# Patient Record
Sex: Male | Born: 1975 | Race: White | Hispanic: No | Marital: Married | Smoking: Never smoker
Health system: Southern US, Community
[De-identification: ages and names within clinical notes are randomized; demographics above are authoritative.]

## PROBLEM LIST (undated history)

## (undated) DIAGNOSIS — G40909 Epilepsy, unspecified, not intractable, without status epilepticus: Secondary | ICD-10-CM

## (undated) HISTORY — DX: Epilepsy, unspecified, not intractable, without status epilepticus: G40.909

## (undated) HISTORY — PX: NO PAST SURGERIES: SHX2092

---

## 1996-06-26 DIAGNOSIS — G40909 Epilepsy, unspecified, not intractable, without status epilepticus: Secondary | ICD-10-CM

## 1996-06-26 HISTORY — DX: Epilepsy, unspecified, not intractable, without status epilepticus: G40.909

## 2010-02-15 ENCOUNTER — Ambulatory Visit: Payer: Self-pay | Admitting: Internal Medicine

## 2010-02-15 DIAGNOSIS — B351 Tinea unguium: Secondary | ICD-10-CM

## 2010-02-15 DIAGNOSIS — B079 Viral wart, unspecified: Secondary | ICD-10-CM | POA: Insufficient documentation

## 2010-02-15 DIAGNOSIS — G40909 Epilepsy, unspecified, not intractable, without status epilepticus: Secondary | ICD-10-CM | POA: Insufficient documentation

## 2010-02-15 DIAGNOSIS — R569 Unspecified convulsions: Secondary | ICD-10-CM

## 2010-02-15 DIAGNOSIS — B353 Tinea pedis: Secondary | ICD-10-CM

## 2010-02-15 LAB — CONVERTED CEMR LAB: Vit D, 25-Hydroxy: 44 ng/mL (ref 30–89)

## 2010-02-16 LAB — CONVERTED CEMR LAB
Albumin: 4.8 g/dL (ref 3.5–5.2)
Alkaline Phosphatase: 60 units/L (ref 39–117)
BUN: 18 mg/dL (ref 6–23)
Basophils Absolute: 0 10*3/uL (ref 0.0–0.1)
Bilirubin, Direct: 0.1 mg/dL (ref 0.0–0.3)
CO2: 31 meq/L (ref 19–32)
Calcium: 9.4 mg/dL (ref 8.4–10.5)
Creatinine, Ser: 0.9 mg/dL (ref 0.4–1.5)
Eosinophils Absolute: 0.1 10*3/uL (ref 0.0–0.7)
Glucose, Bld: 108 mg/dL — ABNORMAL HIGH (ref 70–99)
HDL: 46.8 mg/dL (ref >39.00)
Ketones, ur: NEGATIVE mg/dL
Lymphocytes Relative: 40.5 % (ref 12.0–46.0)
MCHC: 34.6 g/dL (ref 30.0–36.0)
MCV: 95.8 fL (ref 78.0–100.0)
Monocytes Absolute: 0.3 10*3/uL (ref 0.1–1.0)
Neutrophils Relative %: 49 % (ref 43.0–77.0)
RDW: 13.6 % (ref 11.5–14.6)
Specific Gravity, Urine: 1.025 (ref 1.000–1.030)
TSH: 1 microintl units/mL (ref 0.35–5.50)
Total CHOL/HDL Ratio: 3
Total Protein, Urine: NEGATIVE mg/dL
Triglycerides: 105 mg/dL (ref 0.0–149.0)
Urine Glucose: NEGATIVE mg/dL
Urobilinogen, UA: 0.2 (ref 0.0–1.0)
VLDL: 21 mg/dL (ref 0.0–40.0)

## 2010-03-03 ENCOUNTER — Encounter: Payer: Self-pay | Admitting: Internal Medicine

## 2010-03-03 ENCOUNTER — Telehealth (INDEPENDENT_AMBULATORY_CARE_PROVIDER_SITE_OTHER): Payer: Self-pay | Admitting: *Deleted

## 2010-07-26 NOTE — Assessment & Plan Note (Signed)
Summary: CPX/[employer]/#/CD -- ok dr Macario Golds   Vital Signs:  Patient profile:   35 year old male Height:      71 inches Weight:      202 pounds BMI:     28.28 O2 Sat:      98 % on Room air Temp:     98.1 degrees F oral Pulse rate:   80 / minute Pulse rhythm:   regular Resp:     16 per minute BP sitting:   130 / 98  (left arm) Cuff size:   regular  Vitals Entered By: Lanier Prude, CMA(AAMA) (February 15, 2010 10:33 AM)  O2 Flow:  Room air CC: Est PCP; c/o multiple skin lesions Is Patient Diabetic? No   CC:  Est PCP; c/o multiple skin lesions.  History of Present Illness: The patient presents for a preventive health examination - new pt. C/o warts - toe and and finger C/o toe fungus and a "mole"  Preventive Screening-Counseling & Management  Alcohol-Tobacco     Smoking Status: never  Caffeine-Diet-Exercise     Does Patient Exercise: yes  Current Medications (verified): 1)  Dilantin 100 Mg Caps (Phenytoin Sodium Extended) .Marland Kitchen.. 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 Married Never Smoked Regular exercise-yes Smoking Status:  never Does Patient Exercise:  yes  Review of Systems  The patient denies anorexia, fever, weight loss, weight gain, vision loss, decreased hearing, hoarseness, chest pain, syncope, dyspnea on exertion, peripheral edema, prolonged cough, headaches, hemoptysis, abdominal pain, melena, hematochezia, severe indigestion/heartburn, hematuria, incontinence, genital sores, muscle weakness, suspicious skin lesions, transient blindness, difficulty walking, depression, unusual weight change, abnormal bleeding, enlarged lymph nodes, angioedema, and testicular masses.    Physical Exam  General:   Well-developed,well-nourished,in no acute distress; alert,appropriate and cooperative throughout examination Head:  Normocephalic and atraumatic without obvious abnormalities. No apparent alopecia or balding. Eyes:  No corneal or conjunctival inflammation noted. EOMI. Perrla. Funduscopic exam benign, without hemorrhages, exudates or papilledema. Vision grossly normal. Ears:  External ear exam shows no significant lesions or deformities.  Otoscopic examination reveals clear canals, tympanic membranes are intact bilaterally without bulging, retraction, inflammation or discharge. Hearing is grossly normal bilaterally. Nose:  External nasal examination shows no deformity or inflammation. Nasal mucosa are pink and moist without lesions or exudates. Mouth:  Oral mucosa and oropharynx without lesions or exudates.  Teeth in good repair. Neck:  No deformities, masses, or tenderness noted. Lungs:  Normal respiratory effort, chest expands symmetrically. Lungs are clear to auscultation, no crackles or wheezes. Heart:  Normal rate and regular rhythm. S1 and S2 normal without gallop, murmur, click, rub or other extra sounds. Abdomen:  Bowel sounds positive,abdomen soft and non-tender without masses, organomegaly or hernias noted. Genitalia:  Testes bilaterally descended without nodularity, tenderness or masses. No scrotal masses or lesions. No penis lesions or urethral discharge. Msk:  No deformity or scoliosis noted of thoracic or lumbar spine.   Pulses:  R and L carotid,radial,femoral,dorsalis pedis and posterior tibial pulses are full and equal bilaterally Extremities:  No clubbing, cyanosis, edema, or deformity noted with normal full range of motion of all joints.   Neurologic:  No cranial nerve deficits noted. Station and gait are normal. Plantar reflexes are down-going bilaterally. DTRs are symmetrical throughout. Sensory, motor  and coordinative functions appear intact. Skin:  L 3d finger wart R 5th toe  wart L great toe w/onycho and T pedis no susp. moles Cervical Nodes:  No lymphadenopathy noted Inguinal Nodes:  No significant adenopathy Psych:  Cognition and judgment appear intact. Alert and cooperative with normal attention span and concentration. No apparent delusions, illusions, hallucinations   Impression & Recommendations:  Problem # 1:  HEALTH MAINTENANCE EXAM (ICD-V70.0) Assessment New Health and age related issues were discussed. Available screening tests and vaccinations were discussed as well. Healthy life style including good diet and exercise was discussed.  Labs ordered Orders: TLB-BMP (Basic Metabolic Panel-BMET) (80048-METABOL) TLB-CBC Platelet - w/Differential (85025-CBCD) TLB-Hepatic/Liver Function Pnl (80076-HEPATIC) TLB-Lipid Panel (80061-LIPID) TLB-TSH (Thyroid Stimulating Hormone) (84443-TSH)  Problem # 2:  TINEA PEDIS (ICD-110.4) L foot Assessment: New  His updated medication list for this problem includes:    Penlac 8 % Soln (Ciclopirox) ..... Use once daily on affected nail(s). once a week remove the build-up with an alcohol swab    Ketoconazole 2 % Crea (Ketoconazole) ..... 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 Use once daily on affected nail(s). once a week remove the build-up with an alcohol swab    Ketoconazole 2 % Crea (Ketoconazole) ..... Use bid  Problem # 4:  SEIZURE DISORDER (ICD-780.39) Assessment: Unchanged  His updated medication list for this problem includes:    Dilantin 100 Mg Caps (Phenytoin sodium extended) .Marland Kitchen... 1 once daily  Orders: T-Vitamin D (25-Hydroxy) [id number]) TLB-B12, Serum-Total ONLY (82607-B12) TLB-Udip ONLY (81003-UDIP)  Problem # 5:  WART, VIRAL (ICD-078.10) x2 Assessment: New  Orders: Wart Destruct <14 (17110) Procedure: cryo Indication: warts Risks incl. scar(s), incomplete  removal, ect.  and benefits discussed   2   lesion(s) on ( one on L 5th digit and one on L finger) was/were treated with liqid N2 in usual fasion.  Tolerated well. Compl. none. Wound care instructions given.   Complete Medication List: 1)  Dilantin 100 Mg Caps (Phenytoin sodium extended) .Marland Kitchen.. 1 once daily 2)  Vitamin D 1000 Unit Tabs (Cholecalciferol) .Marland Kitchen.. 1 by mouth qd 3)  Penlac 8 % Soln (Ciclopirox) .... Use once daily on affected nail(s). once a week remove the build-up with an alcohol swab 4)  Ketoconazole 2 % Crea (Ketoconazole) .... Use bid 5)  Vitamin B-12 500 Mcg Tabs (Cyanocobalamin) .Marland Kitchen.. 1 by mouth once daily for vitamin b12 deficiency  Other Orders: Tdap => 60yrs IM (02725) Admin 1st Vaccine (36644)  Patient Instructions: 1)  Please schedule a follow-up appointment in 1 year. Prescriptions: KETOCONAZOLE 2 % CREA (KETOCONAZOLE) use bid  #90 g x 3   Entered and Authorized by:   Tresa Garter MD   Signed by:   Tresa Garter MD on 02/15/2010   Method used:   Print then Give to Patient   RxID:   [id number] PENLAC 8 % SOLN (CICLOPIROX) Use once daily on affected nail(s). Once a week remove the build-up with an alcohol swab  #1 x 2   Entered and Authorized by:   Tresa Garter MD   Signed by:   Tresa Garter MD on 02/15/2010   Method used:   Print then Give to Patient   RxID:   [id number]    Immunizations Administered:  Tetanus Vaccine:    Vaccine Type: Tdap    Site:  left deltoid    Mfr: GlaxoSmithKline    Dose: 0.5 ml    Route: IM    Given by: Lanier Prude, CMA(AAMA)    Exp. Date: 04/14/2012    Lot #: [id number]    VIS given: 05/14/07 version given February 15, 2010.

## 2010-07-26 NOTE — Progress Notes (Signed)
Summary: PA-Ciclopirox  Phone Note From Pharmacy   Summary of Call: PA-Ciclopirox faxed to [facility] @ [phone] awaiting approval. Dagoberto Reef  March 03, 2010 8:35 AM  PA -approved 02/15/10-02/15/11, pt aware.  Initial call taken by: Dagoberto Reef,  March 03, 2010 2:45 PM

## 2010-07-26 NOTE — Medication Information (Signed)
Summary: Prior Autho & Approved for Ciclopirox/[facility]  Prior Autho & Approved for Ciclopirox/[facility]   Imported By: Sherian Rein 03/07/2010 12:26:55  _____________________________________________________________________  External Attachment:    Type:   Image     Comment:   External Document

## 2010-08-18 ENCOUNTER — Encounter: Payer: Self-pay | Admitting: Internal Medicine

## 2010-08-25 ENCOUNTER — Other Ambulatory Visit: Payer: Self-pay | Admitting: Neurology

## 2010-08-25 DIAGNOSIS — Z79899 Other long term (current) drug therapy: Secondary | ICD-10-CM

## 2010-09-01 NOTE — Letter (Signed)
Summary: [facility]  [facility]   Imported By: Sherian Rein 08/26/2010 12:54:52  _____________________________________________________________________  External Attachment:    Type:   Image     Comment:   External Document

## 2010-11-09 ENCOUNTER — Other Ambulatory Visit: Payer: Self-pay | Admitting: Internal Medicine

## 2010-11-11 NOTE — Telephone Encounter (Signed)
Ok to Rf? Med is not active in [facility] 

## 2010-11-14 NOTE — Telephone Encounter (Signed)
OK to fill this prescription with additional refills x0 Thank you!  

## 2010-11-15 NOTE — Telephone Encounter (Signed)
Ok to Rf? Med is not active in [facility]ric[facility]

## 2010-11-22 ENCOUNTER — Telehealth: Payer: Self-pay | Admitting: *Deleted

## 2010-11-22 NOTE — Telephone Encounter (Signed)
OK to fill this prescription with additional refills x1 Thank you!  

## 2010-11-22 NOTE — Telephone Encounter (Signed)
rec rf req for Ciclopirox 8% soln. Use qd on affected nails. Once week remove build up with alcohol. # 6. Med is not active in [facility]..... Ok to Rf?

## 2010-11-23 ENCOUNTER — Other Ambulatory Visit: Payer: Self-pay | Admitting: *Deleted

## 2010-11-23 MED ORDER — CICLOPIROX 8 % EX SOLN: Freq: Every day | CUTANEOUS | Status: DC

## 2010-11-24 ENCOUNTER — Telehealth: Payer: Self-pay | Admitting: *Deleted

## 2010-11-24 NOTE — Telephone Encounter (Signed)
Called to obtain PA on Ciclopirox 8 %. Form is being faxed.

## 2010-11-25 NOTE — Telephone Encounter (Signed)
Pt's wife informed med was approved.

## 2010-12-05 ENCOUNTER — Encounter: Payer: Self-pay | Admitting: Internal Medicine

## 2010-12-06 ENCOUNTER — Encounter: Payer: Self-pay | Admitting: Internal Medicine

## 2010-12-06 ENCOUNTER — Other Ambulatory Visit (INDEPENDENT_AMBULATORY_CARE_PROVIDER_SITE_OTHER): Payer: Managed Care, Other (non HMO)

## 2010-12-06 ENCOUNTER — Ambulatory Visit (INDEPENDENT_AMBULATORY_CARE_PROVIDER_SITE_OTHER): Payer: Managed Care, Other (non HMO) | Admitting: Internal Medicine

## 2010-12-06 DIAGNOSIS — R5383 Other fatigue: Secondary | ICD-10-CM

## 2010-12-06 DIAGNOSIS — B351 Tinea unguium: Secondary | ICD-10-CM

## 2010-12-06 DIAGNOSIS — R5381 Other malaise: Secondary | ICD-10-CM

## 2010-12-06 DIAGNOSIS — L600 Ingrowing nail: Secondary | ICD-10-CM

## 2010-12-06 LAB — CBC WITH DIFFERENTIAL/PLATELET
Basophils Relative: 0.4 % (ref 0.0–3.0)
Eosinophils Relative: 3.5 % (ref 0.0–5.0)
HCT: 43.5 % (ref 39.0–52.0)
Lymphs Abs: 1.8 10*3/uL (ref 0.7–4.0)
MCV: 94.3 fl (ref 78.0–100.0)
Monocytes Absolute: 0.3 10*3/uL (ref 0.1–1.0)
Monocytes Relative: 7.2 % (ref 3.0–12.0)
Neutrophils Relative %: 43.2 % (ref 43.0–77.0)
RBC: 4.61 Mil/uL (ref 4.22–5.81)
WBC: 4 10*3/uL — ABNORMAL LOW (ref 4.5–10.5)

## 2010-12-06 LAB — URINALYSIS
Nitrite: NEGATIVE
Specific Gravity, Urine: 1.02 (ref 1.000–1.030)
Total Protein, Urine: NEGATIVE
Urine Glucose: NEGATIVE
Urobilinogen, UA: 0.2 (ref 0.0–1.0)

## 2010-12-06 LAB — COMPREHENSIVE METABOLIC PANEL
AST: 20 U/L (ref 0–37)
Alkaline Phosphatase: 55 U/L (ref 39–117)
BUN: 19 mg/dL (ref 6–23)
Creatinine, Ser: 1 mg/dL (ref 0.4–1.5)

## 2010-12-06 LAB — TESTOSTERONE: Testosterone: 375.91 ng/dL (ref 350.00–890.00)

## 2010-12-06 LAB — TSH: TSH: 1.25 u[IU]/mL (ref 0.35–5.50)

## 2010-12-06 MED ORDER — TERBINAFINE HCL 250 MG PO TABS: ORAL_TABLET | ORAL | Status: DC

## 2010-12-06 NOTE — Progress Notes (Signed)
  Subjective:    Patient ID: Jay Hall, male    DOB: January 23, 1976, 35 y.o.   MRN: [id number]  HPI  C/o toenail fungus coming back after nail loss/regrowth   Review of Systems  Constitutional: Negative for fatigue.  Cardiovascular: Negative for leg swelling.  Gastrointestinal: Negative for diarrhea and abdominal distention.  Neurological: Negative for seizures.       Objective:   Physical Exam  Constitutional: No distress.  Musculoskeletal: He exhibits no edema.  Skin:       L big toenail w/nail base and sides onycho and ingr toenail w/skin fungus  Psychiatric: He has a normal mood and affect. Judgment normal.       L big toe   Assessment & Plan:

## 2010-12-07 ENCOUNTER — Telehealth: Payer: Self-pay | Admitting: Internal Medicine

## 2010-12-07 DIAGNOSIS — E291 Testicular hypofunction: Secondary | ICD-10-CM

## 2010-12-07 DIAGNOSIS — L600 Ingrowing nail: Secondary | ICD-10-CM | POA: Insufficient documentation

## 2010-12-07 NOTE — Assessment & Plan Note (Signed)
Podiatry consult

## 2010-12-07 NOTE — Assessment & Plan Note (Signed)
He is failing Penlac. We will start Lmisil po.  Potential benefits of a long term Lamisil  use as well as potential risks  and complications were explained to the patient and were aknowledged. Dilantin interaction was checked. Will monitor labs

## 2010-12-07 NOTE — Telephone Encounter (Signed)
Jay Hall, please, inform patient that all labs are normal except for low testost. Take Lamisil Start exercising. Check testost, LH, FSH in 1 mo. RTC 5 wks Thx

## 2010-12-08 NOTE — Telephone Encounter (Signed)
Left mess for patient to call back.  

## 2010-12-08 NOTE — Telephone Encounter (Signed)
Pt informed

## 2011-01-09 ENCOUNTER — Other Ambulatory Visit: Payer: Self-pay | Admitting: *Deleted

## 2011-01-09 ENCOUNTER — Other Ambulatory Visit (INDEPENDENT_AMBULATORY_CARE_PROVIDER_SITE_OTHER): Payer: Managed Care, Other (non HMO)

## 2011-01-09 DIAGNOSIS — E291 Testicular hypofunction: Secondary | ICD-10-CM

## 2011-01-09 DIAGNOSIS — R5383 Other fatigue: Secondary | ICD-10-CM

## 2011-01-09 DIAGNOSIS — B351 Tinea unguium: Secondary | ICD-10-CM

## 2011-01-09 LAB — CBC WITH DIFFERENTIAL/PLATELET
Basophils Relative: 0.4 % (ref 0.0–3.0)
Eosinophils Relative: 3.4 % (ref 0.0–5.0)
HCT: 41.6 % (ref 39.0–52.0)
Hemoglobin: 14.3 g/dL (ref 13.0–17.0)
Lymphs Abs: 1.1 10*3/uL (ref 0.7–4.0)
MCV: 95.2 fl (ref 78.0–100.0)
Monocytes Absolute: 0.3 10*3/uL (ref 0.1–1.0)
Monocytes Relative: 9.9 % (ref 3.0–12.0)
Neutro Abs: 1.3 10*3/uL — ABNORMAL LOW (ref 1.4–7.7)
Platelets: 165 10*3/uL (ref 150.0–400.0)
RBC: 4.37 Mil/uL (ref 4.22–5.81)
WBC: 2.8 10*3/uL — ABNORMAL LOW (ref 4.5–10.5)

## 2011-01-09 LAB — COMPREHENSIVE METABOLIC PANEL
Albumin: 4.6 g/dL (ref 3.5–5.2)
Alkaline Phosphatase: 56 U/L (ref 39–117)
BUN: 16 mg/dL (ref 6–23)
Calcium: 8.7 mg/dL (ref 8.4–10.5)
Chloride: 102 mEq/L (ref 96–112)
Glucose, Bld: 75 mg/dL (ref 70–99)
Potassium: 3.9 mEq/L (ref 3.5–5.1)
Sodium: 137 mEq/L (ref 135–145)
Total Protein: 6.3 g/dL (ref 6.0–8.3)

## 2011-01-12 ENCOUNTER — Ambulatory Visit (INDEPENDENT_AMBULATORY_CARE_PROVIDER_SITE_OTHER): Payer: Managed Care, Other (non HMO) | Admitting: Internal Medicine

## 2011-01-12 ENCOUNTER — Encounter: Payer: Self-pay | Admitting: Internal Medicine

## 2011-01-12 DIAGNOSIS — R569 Unspecified convulsions: Secondary | ICD-10-CM

## 2011-01-12 DIAGNOSIS — B353 Tinea pedis: Secondary | ICD-10-CM

## 2011-01-12 DIAGNOSIS — R6882 Decreased libido: Secondary | ICD-10-CM | POA: Insufficient documentation

## 2011-01-12 DIAGNOSIS — B351 Tinea unguium: Secondary | ICD-10-CM

## 2011-01-12 DIAGNOSIS — R7989 Other specified abnormal findings of blood chemistry: Secondary | ICD-10-CM

## 2011-01-12 NOTE — Assessment & Plan Note (Signed)
On Rx 

## 2011-01-12 NOTE — Assessment & Plan Note (Signed)
Worse. Recheck in 4-6 wks

## 2011-01-12 NOTE — Progress Notes (Signed)
  Subjective:    Patient ID: Jay Hall, male    DOB: 10/12/1975, 35 y.o.   MRN: [id number]  HPI   F/u toenail fungus coming back after nail loss/regrowth F/u abn labs, decreased libido  Review of Systems  Constitutional: Negative for fatigue.  Respiratory: Negative for cough.   Cardiovascular: Negative for leg swelling.  Gastrointestinal: Negative for abdominal pain, diarrhea and abdominal distention.  Neurological: Negative for seizures.  Psychiatric/Behavioral: Negative for behavioral problems, sleep disturbance and decreased concentration. The patient is not nervous/anxious.        Objective:   Physical Exam  Constitutional: No distress.  Pulmonary/Chest: No respiratory distress.  Abdominal: He exhibits no distension.  Musculoskeletal: He exhibits no edema.  Skin:       L big toenail w/nail base and sides onycho and ingr toenail w/skin fungus  Psychiatric: He has a normal mood and affect. Judgment normal.       L big toenail w/o improvement so far  Lab Results  Component Value Date   WBC 2.8* 01/09/2011   HGB 14.3 01/09/2011   HCT 41.6 01/09/2011   PLT 165.0 01/09/2011   CHOL 157 02/15/2010   TRIG 105.0 02/15/2010   HDL 46.80 02/15/2010   ALT 27 01/09/2011   AST 24 01/09/2011   NA 137 01/09/2011   K 3.9 01/09/2011   CL 102 01/09/2011   CREATININE 1.0 01/09/2011   BUN 16 01/09/2011   CO2 30 01/09/2011   TSH 1.25 12/06/2010   MICROALBUR 0.8 12/06/2010     Assessment & Plan:

## 2011-01-12 NOTE — Assessment & Plan Note (Signed)
Better Cont L arginine

## 2011-01-12 NOTE — Assessment & Plan Note (Signed)
On Dilantin 

## 2011-01-12 NOTE — Assessment & Plan Note (Addendum)
On Rx. Monitoring labs

## 2011-02-14 ENCOUNTER — Ambulatory Visit: Payer: Managed Care, Other (non HMO) | Admitting: Internal Medicine

## 2011-03-21 ENCOUNTER — Other Ambulatory Visit (INDEPENDENT_AMBULATORY_CARE_PROVIDER_SITE_OTHER): Payer: Managed Care, Other (non HMO)

## 2011-03-21 DIAGNOSIS — R7989 Other specified abnormal findings of blood chemistry: Secondary | ICD-10-CM

## 2011-03-21 DIAGNOSIS — B351 Tinea unguium: Secondary | ICD-10-CM

## 2011-03-21 LAB — CBC WITH DIFFERENTIAL/PLATELET
Basophils Relative: 0.4 % (ref 0.0–3.0)
Eosinophils Relative: 2.9 % (ref 0.0–5.0)
HCT: 43.9 % (ref 39.0–52.0)
Hemoglobin: 15 g/dL (ref 13.0–17.0)
Lymphs Abs: 1.8 10*3/uL (ref 0.7–4.0)
MCV: 95.4 fl (ref 78.0–100.0)
Monocytes Absolute: 0.3 10*3/uL (ref 0.1–1.0)
Neutro Abs: 1.8 10*3/uL (ref 1.4–7.7)
Neutrophils Relative %: 44.7 % (ref 43.0–77.0)
RBC: 4.61 Mil/uL (ref 4.22–5.81)
WBC: 4 10*3/uL — ABNORMAL LOW (ref 4.5–10.5)

## 2011-03-21 LAB — COMPREHENSIVE METABOLIC PANEL
AST: 30 U/L (ref 0–37)
Alkaline Phosphatase: 64 U/L (ref 39–117)
BUN: 17 mg/dL (ref 6–23)
Creatinine, Ser: 1 mg/dL (ref 0.4–1.5)
Glucose, Bld: 92 mg/dL (ref 70–99)
Total Bilirubin: 0.5 mg/dL (ref 0.3–1.2)

## 2011-04-24 ENCOUNTER — Ambulatory Visit: Payer: Managed Care, Other (non HMO) | Admitting: Internal Medicine

## 2011-07-11 ENCOUNTER — Encounter: Payer: Self-pay | Admitting: Internal Medicine

## 2011-07-11 ENCOUNTER — Ambulatory Visit (INDEPENDENT_AMBULATORY_CARE_PROVIDER_SITE_OTHER): Payer: Managed Care, Other (non HMO) | Admitting: Internal Medicine

## 2011-07-11 VITALS — BP 122/88 | HR 80 | Temp 98.1°F | Resp 16 | Wt 203.0 lb

## 2011-07-11 DIAGNOSIS — B351 Tinea unguium: Secondary | ICD-10-CM

## 2011-07-11 DIAGNOSIS — R6882 Decreased libido: Secondary | ICD-10-CM

## 2011-07-11 NOTE — Assessment & Plan Note (Addendum)
Pod cons for poss nail removal vs other L big toe - refractory

## 2011-07-19 NOTE — Progress Notes (Signed)
  Subjective:    Patient ID: Jay Hall, male    DOB: [birth year], 36 y.o.   MRN: [id number]  HPI  F/u L big toe onycho: he is not happy with the treatment results so far (Penlac, PO Lamisil, topical Ketoconazole)  Review of Systems  Constitutional: Negative for activity change, fatigue and unexpected weight change.  Skin: Negative for rash.  Psychiatric/Behavioral: Negative for dysphoric mood.       Objective:   Physical Exam  Constitutional: He appears well-developed. No distress.  Skin:       L big toe toenail with onych (poss 2-3 mm prox clearing is present)          Assessment & Plan:

## 2011-07-19 NOTE — Assessment & Plan Note (Signed)
Resolved

## 2011-09-09 ENCOUNTER — Encounter: Payer: Self-pay | Admitting: Family Medicine

## 2011-09-09 ENCOUNTER — Ambulatory Visit (INDEPENDENT_AMBULATORY_CARE_PROVIDER_SITE_OTHER): Payer: Managed Care, Other (non HMO) | Admitting: Family Medicine

## 2011-09-09 VITALS — BP 128/82 | HR 118 | Temp 100.0°F | Wt 203.0 lb

## 2011-09-09 DIAGNOSIS — J329 Chronic sinusitis, unspecified: Secondary | ICD-10-CM

## 2011-09-09 MED ORDER — AZITHROMYCIN 250 MG PO TABS: ORAL_TABLET | ORAL | Status: AC

## 2011-09-09 NOTE — Progress Notes (Signed)
  Subjective:    Patient ID: Jay Hall, male    DOB: 06-07-1976, 36 y.o.   MRN: [id number]  HPI Here for 3 days of aches, HA, PND, sinus pressure, and a dry cough. No fever   Review of Systems  Constitutional: Positive for fatigue.  HENT: Positive for congestion, postnasal drip and sinus pressure.   Eyes: Negative.   Respiratory: Positive for cough.        Objective:   Physical Exam  Constitutional: He appears well-developed and well-nourished. No distress.  HENT:  Right Ear: External ear normal.  Left Ear: External ear normal.  Nose: Nose normal.  Mouth/Throat: Oropharynx is clear and moist. No oropharyngeal exudate.  Eyes: Conjunctivae are normal.  Pulmonary/Chest: Effort normal and breath sounds normal.  Lymphadenopathy:    He has no cervical adenopathy.          Assessment & Plan:  Rest, fluids, Mucinex

## 2011-09-10 ENCOUNTER — Encounter: Payer: Self-pay | Admitting: Family Medicine

## 2012-07-12 ENCOUNTER — Telehealth: Payer: Self-pay | Admitting: *Deleted

## 2012-07-12 DIAGNOSIS — Z0000 Encounter for general adult medical examination without abnormal findings: Secondary | ICD-10-CM

## 2012-07-12 NOTE — Telephone Encounter (Signed)
Message copied by Merrilyn Puma on Fri Jul 12, 2012 10:40 AM ------      Message from: Burnett Harry      Created: Thu Jun 27, 2012 10:31 AM      Regarding: CPE 07/30/12       [facility]

## 2012-07-12 NOTE — Telephone Encounter (Signed)
CPE 07/30/12 labs entered.

## 2012-07-25 ENCOUNTER — Other Ambulatory Visit (INDEPENDENT_AMBULATORY_CARE_PROVIDER_SITE_OTHER): Payer: Managed Care, Other (non HMO)

## 2012-07-25 DIAGNOSIS — Z0000 Encounter for general adult medical examination without abnormal findings: Secondary | ICD-10-CM

## 2012-07-25 LAB — URINALYSIS, ROUTINE W REFLEX MICROSCOPIC
Bilirubin Urine: NEGATIVE
Hgb urine dipstick: NEGATIVE
Total Protein, Urine: NEGATIVE
Urine Glucose: NEGATIVE
pH: 5.5 (ref 5.0–8.0)

## 2012-07-25 LAB — BASIC METABOLIC PANEL
Chloride: 106 mEq/L (ref 96–112)
GFR: 84.7 mL/min (ref >60.00)
Potassium: 4.2 mEq/L (ref 3.5–5.1)
Sodium: 141 mEq/L (ref 135–145)

## 2012-07-25 LAB — CBC WITH DIFFERENTIAL/PLATELET
Basophils Relative: 0.4 % (ref 0.0–3.0)
Eosinophils Relative: 2.4 % (ref 0.0–5.0)
HCT: 45 % (ref 39.0–52.0)
Hemoglobin: 15.2 g/dL (ref 13.0–17.0)
Lymphs Abs: 1.6 10*3/uL (ref 0.7–4.0)
MCV: 93.5 fl (ref 78.0–100.0)
Monocytes Absolute: 0.4 10*3/uL (ref 0.1–1.0)
Neutro Abs: 1.9 10*3/uL (ref 1.4–7.7)
Platelets: 176 10*3/uL (ref 150.0–400.0)
WBC: 4 10*3/uL — ABNORMAL LOW (ref 4.5–10.5)

## 2012-07-25 LAB — HEPATIC FUNCTION PANEL
ALT: 25 U/L (ref 0–53)
AST: 19 U/L (ref 0–37)
Alkaline Phosphatase: 46 U/L (ref 39–117)
Bilirubin, Direct: 0.1 mg/dL (ref 0.0–0.3)
Total Bilirubin: 0.7 mg/dL (ref 0.3–1.2)

## 2012-07-25 LAB — LIPID PANEL
Cholesterol: 134 mg/dL (ref 0–200)
LDL Cholesterol: 76 mg/dL (ref 0–99)
Total CHOL/HDL Ratio: 3

## 2012-07-30 ENCOUNTER — Ambulatory Visit (INDEPENDENT_AMBULATORY_CARE_PROVIDER_SITE_OTHER): Payer: BC Managed Care – PPO | Admitting: Internal Medicine

## 2012-07-30 ENCOUNTER — Encounter: Payer: Self-pay | Admitting: Internal Medicine

## 2012-07-30 VITALS — BP 130/98 | HR 92 | Temp 97.5°F | Resp 16 | Ht 72.0 in | Wt 194.0 lb

## 2012-07-30 DIAGNOSIS — R569 Unspecified convulsions: Secondary | ICD-10-CM

## 2012-07-30 DIAGNOSIS — R7989 Other specified abnormal findings of blood chemistry: Secondary | ICD-10-CM

## 2012-07-30 DIAGNOSIS — B351 Tinea unguium: Secondary | ICD-10-CM

## 2012-07-30 DIAGNOSIS — Z0000 Encounter for general adult medical examination without abnormal findings: Secondary | ICD-10-CM | POA: Insufficient documentation

## 2012-07-30 MED ORDER — FLUTICASONE PROPIONATE 0.05 % EX CREA: TOPICAL_CREAM | Freq: Two times a day (BID) | CUTANEOUS | Status: DC

## 2012-07-30 NOTE — Assessment & Plan Note (Signed)
Continue with current prescription therapy as reflected on the Med list.  

## 2012-07-30 NOTE — Assessment & Plan Note (Signed)
We discussed age appropriate health related issues, including available/recomended screening tests and vaccinations. We discussed a need for adhering to healthy diet and exercise. Labs/EKG were reviewed/ordered. All questions were answered.   

## 2012-07-30 NOTE — Assessment & Plan Note (Signed)
2/14 better post-laser treatment

## 2012-07-30 NOTE — Progress Notes (Signed)
  Subjective:    Patient ID: Jay Hall, male    DOB: 1975-09-04, 37 y.o.   MRN: [id number]  HPI  The patient is here for a wellness exam. The patient has been doing well overall without major physical or psychological issues going on lately.  F/u toenail fungus coming back after nail loss/regrowth  Wt Readings from Last 3 Encounters:  07/30/12 194 lb (87.998 kg)  09/09/11 203 lb (92.08 kg)  07/11/11 203 lb (92.08 kg)   BP Readings from Last 3 Encounters:  07/30/12 130/98  09/09/11 128/82  07/11/11 122/88        Review of Systems  Constitutional: Negative for fatigue.  Respiratory: Negative for cough.   Cardiovascular: Negative for leg swelling.  Gastrointestinal: Negative for abdominal pain, diarrhea and abdominal distention.  Neurological: Negative for seizures.  Psychiatric/Behavioral: Negative for behavioral problems, sleep disturbance and decreased concentration. The patient is not nervous/anxious.        Objective:   Physical Exam  Constitutional: No distress.  Pulmonary/Chest: No respiratory distress.  Abdominal: He exhibits no distension.  Musculoskeletal: He exhibits no edema.  Skin:       L big toenail w/nail base and sides onycho and ingr toenail w/skin fungus  Psychiatric: He has a normal mood and affect. Judgment normal.  GU; self ok    L big toenail with improvement   Lab Results  Component Value Date   WBC 2.8* 01/09/2011   HGB 14.3 01/09/2011   HCT 41.6 01/09/2011   PLT 165.0 01/09/2011   CHOL 157 02/15/2010   TRIG 105.0 02/15/2010   HDL 46.80 02/15/2010   ALT 27 01/09/2011   AST 24 01/09/2011   NA 137 01/09/2011   K 3.9 01/09/2011   CL 102 01/09/2011   CREATININE 1.0 01/09/2011   BUN 16 01/09/2011   CO2 30 01/09/2011   TSH 1.25 12/06/2010   MICROALBUR 0.8 12/06/2010     Assessment & Plan:

## 2012-07-30 NOTE — Assessment & Plan Note (Signed)
Poss due to Dilantin

## 2013-04-05 ENCOUNTER — Other Ambulatory Visit: Payer: Self-pay | Admitting: Neurology

## 2013-04-23 ENCOUNTER — Ambulatory Visit (INDEPENDENT_AMBULATORY_CARE_PROVIDER_SITE_OTHER): Payer: BC Managed Care – PPO | Admitting: Nurse Practitioner

## 2013-04-23 ENCOUNTER — Encounter: Payer: Self-pay | Admitting: Nurse Practitioner

## 2013-04-23 VITALS — BP 120/76 | HR 85 | Ht 74.75 in | Wt 188.0 lb

## 2013-04-23 DIAGNOSIS — G40209 Localization-related (focal) (partial) symptomatic epilepsy and epileptic syndromes with complex partial seizures, not intractable, without status epilepticus: Secondary | ICD-10-CM | POA: Insufficient documentation

## 2013-04-23 DIAGNOSIS — Z79899 Other long term (current) drug therapy: Secondary | ICD-10-CM

## 2013-04-23 LAB — CBC WITH DIFFERENTIAL/PLATELET
Basos: 1 %
Eos: 3 %
HCT: 45 % (ref 37.5–51.0)
MCV: 92 fL (ref 79–97)
Monocytes Absolute: 0.3 10*3/uL (ref 0.1–0.9)
Monocytes: 9 %
Neutrophils Relative %: 43 %
RBC: 4.88 x10E6/uL (ref 4.14–5.80)
RDW: 12.9 % (ref 12.3–15.4)
WBC: 3.7 10*3/uL (ref 3.4–10.8)

## 2013-04-23 LAB — COMPREHENSIVE METABOLIC PANEL
Albumin/Globulin Ratio: 2.3 (ref 1.1–2.5)
BUN/Creatinine Ratio: 20 — ABNORMAL HIGH (ref 8–19)
Calcium: 9.4 mg/dL (ref 8.7–10.2)
Chloride: 100 mmol/L (ref 96–108)
Creatinine, Ser: 1.02 mg/dL (ref 0.76–1.27)
GFR calc Af Amer: 108 mL/min/{1.73_m2} (ref >59)
GFR calc non Af Amer: 93 mL/min/{1.73_m2} (ref >59)
Globulin, Total: 2.1 g/dL (ref 1.5–4.5)
Glucose: 84 mg/dL (ref 65–99)
Potassium: 4.4 mmol/L (ref 3.5–5.2)
Total Bilirubin: 0.2 mg/dL (ref 0.0–1.2)
Total Protein: 7 g/dL (ref 6.0–8.5)

## 2013-04-23 MED ORDER — DILANTIN 100 MG PO CAPS: 400.0000 mg | ORAL_CAPSULE | Freq: Every day | ORAL | Status: DC

## 2013-04-23 NOTE — Progress Notes (Signed)
[facility]  PATIENT: Jay Hall DOB: [birth year]   REASON FOR VISIT: follow up for seizure disorder   HISTORY OF PRESENT ILLNESS: Jay Hall, 37 year old right-handed white male with a history of seizures dating back to adolescence. He was last seen 04/09/2012 by Dr. Anne Hahn The patient indicates that the last seizure was in 1998, and he has been on Brand Dilantin since that time. The last seizure occurred when he tried to taper off of his medications. The patient is on 400 mg of Dilantin daily, and he is tolerating this quite well. The patient is  on vitamin D supplementation. The patient is operating motor vehicle, and he is actively working. Patient has no new neurologic complaints  REVIEW OF SYSTEMS: Full 14 system review of systems performed and notable only for:  Constitutional: N/A  Cardiovascular: N/A  Ear/Nose/Throat: N/A  Skin: N/A  Eyes: N/A  Respiratory: N/A  Gastroitestinal: N/A  Hematology/Lymphatic: N/A  Endocrine: N/A Musculoskeletal:N/A  Allergy/Immunology: N/A  Neurological: N/A Psychiatric: N/A   ALLERGIES: No Known Allergies  HOME MEDICATIONS: Outpatient Prescriptions Prior to Visit  Medication Sig Dispense Refill  . cholecalciferol (VITAMIN D) 1000 UNITS tablet Take 1,000 Units by mouth daily.        Marland Kitchen DILANTIN 100 MG ER capsule TAKE 4 CAPSULES DAILY AS DIRECTED  120 capsule  0  . fluticasone (CUTIVATE) 0.05 % cream Apply topically 2 (two) times daily.  60 g  1  . vitamin B-12 (CYANOCOBALAMIN) 500 MCG tablet Take 500 mcg by mouth daily.        . ciclopirox (PENLAC) 8 % solution Apply topically at bedtime. Apply over nail and surrounding skin. Apply daily over previous coat. After seven (7) days, may remove with alcohol and continue cycle.       No facility-administered medications prior to visit.    PAST MEDICAL HISTORY: Past Medical History  Diagnosis Date  . Epilepsy 1998    Dr. Anne Hahn    PAST SURGICAL  HISTORY: Past Surgical History  Procedure Laterality Date  . No past surgeries      FAMILY HISTORY: Family History  Problem Relation Age of Onset  . Hypertension Mother   . Cancer Father     Prostate  . Hypertension Father     SOCIAL HISTORY: History   Social History  . Marital Status: Married    Spouse Name: Jay Hall    Number of Children: 2  . Years of Education: BA   Occupational History  . 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 Smoking status: Never Smoker   . Smokeless tobacco: Never Used  . Alcohol Use: Yes  . Drug Use: No  . Sexual Activity: Yes   Other Topics Concern  . Not on file   Social History Narrative   Regular exercise-yes   Patient lives at home with with Jay Hall.    Patient has 2 children.    Patient has a BA degree.      PHYSICAL EXAM  Filed Vitals:   04/23/13 0917  BP: 120/76  Pulse: 85  Height: 6' 2.75" (1.899 m)  Weight: 188 lb (85.276 kg)   Body mass index is 23.65 kg/(m^2).  Generalized: Well developed, in no acute distress     Neurological examination   Mentation: Alert oriented to time, place, history taking. Follows all commands speech and language fluent  Cranial nerve II-XII: Pupils were equal round reactive to light extraocular movements were full,  visual field were full on confrontational test. Facial sensation and strength were normal. hearing was intact to finger rubbing bilaterally. Uvula tongue midline. head turning and shoulder shrug and were normal and symmetric.Tongue protrusion into cheek strength was normal. Motor: normal bulk and tone, full strength in the BUE, BLE, fine finger movements normal,  No focal weakness Coordination: finger-nose-finger, heel-to-shin bilaterally, no dysmetria Reflexes: Brachioradialis 2/2, biceps 2/2, triceps 2/2, patellar 2/2, Achilles 2/2, plantar responses were flexor bilaterally. Gait and Station: Rising up from seated position without  assistance, normal stance,  moderate stride, good arm swing, smooth turning, able to perform tiptoe, and heel walking without difficulty. Tandem steady  DIAGNOSTIC DATA (LABS, IMAGING, TESTING) - I reviewed patient records, labs, notes, testing and imaging myself where available.  Lab Results  Component Value Date   WBC 4.0* 07/25/2012   HGB 15.2 07/25/2012   HCT 45.0 07/25/2012   MCV 93.5 07/25/2012   PLT 176.0 07/25/2012      Component Value Date/Time   NA 141 07/25/2012 0731   K 4.2 07/25/2012 0731   CL 106 07/25/2012 0731   CO2 30 07/25/2012 0731   GLUCOSE 90 07/25/2012 0731   BUN 19 07/25/2012 0731   CREATININE 1.1 07/25/2012 0731   CALCIUM 9.3 07/25/2012 0731   PROT 7.1 07/25/2012 0731   ALBUMIN 4.5 07/25/2012 0731   AST 19 07/25/2012 0731   ALT 25 07/25/2012 0731   ALKPHOS 46 07/25/2012 0731   BILITOT 0.7 07/25/2012 0731   GFRNONAA 108.15 02/15/2010 1107   Lab Results  Component Value Date   CHOL 134 07/25/2012   HDL 38.80* 07/25/2012   LDLCALC 76 07/25/2012   TRIG 96.0 07/25/2012   CHOLHDL 3 07/25/2012   No results found for this basename: HGBA1C   Lab Results  Component Value Date   VITAMINB12 241 02/15/2010   Lab Results  Component Value Date   TSH 1.08 07/25/2012      ASSESSMENT AND PLAN  37 y.o. year old male  has a past medical history of Epilepsy (1998). here to followup. Last seizure 1998. Patient is on brand drug, Dilantin.  Will check labs today, CBC, CMP and Dilantin level Will refill Dilantin for 1 year Given information on co pay card F/U in 1 year Nilda Riggs, [facility], [facility], APRN  [facility] [address] [place], [place] [address] [phone]

## 2013-04-23 NOTE — Progress Notes (Signed)
I have read the note, and I agree with the clinical assessment and plan.  Pristine Gladhill KEITH   

## 2013-04-23 NOTE — Patient Instructions (Signed)
Will check labs today Will refill Dilantin for 1 year Given information on co pay card F/U in 1 year

## 2013-04-24 NOTE — Progress Notes (Signed)
Quick Note:  I called and gave the results of labs to pt. (looked good). Pt verbalized understanding. ______

## 2013-05-01 ENCOUNTER — Other Ambulatory Visit: Payer: Self-pay

## 2013-10-30 ENCOUNTER — Encounter: Payer: Self-pay | Admitting: Internal Medicine

## 2013-10-30 ENCOUNTER — Other Ambulatory Visit (INDEPENDENT_AMBULATORY_CARE_PROVIDER_SITE_OTHER): Payer: BC Managed Care – PPO

## 2013-10-30 ENCOUNTER — Ambulatory Visit (INDEPENDENT_AMBULATORY_CARE_PROVIDER_SITE_OTHER): Payer: BC Managed Care – PPO | Admitting: Internal Medicine

## 2013-10-30 VITALS — BP 122/80 | HR 80 | Temp 98.6°F | Resp 16 | Wt 182.0 lb

## 2013-10-30 DIAGNOSIS — D485 Neoplasm of uncertain behavior of skin: Secondary | ICD-10-CM

## 2013-10-30 DIAGNOSIS — R634 Abnormal weight loss: Secondary | ICD-10-CM

## 2013-10-30 DIAGNOSIS — B079 Viral wart, unspecified: Secondary | ICD-10-CM

## 2013-10-30 DIAGNOSIS — R569 Unspecified convulsions: Secondary | ICD-10-CM

## 2013-10-30 LAB — BASIC METABOLIC PANEL
BUN: 17 mg/dL (ref 6–23)
CO2: 31 mEq/L (ref 19–32)
CREATININE: 1.1 mg/dL (ref 0.4–1.5)
Calcium: 9.4 mg/dL (ref 8.4–10.5)
Chloride: 102 mEq/L (ref 96–112)
GFR: 78.08 mL/min (ref >60.00)
GLUCOSE: 91 mg/dL (ref 70–99)
POTASSIUM: 4.4 meq/L (ref 3.5–5.1)
Sodium: 139 mEq/L (ref 135–145)

## 2013-10-30 LAB — CBC WITH DIFFERENTIAL/PLATELET
Basophils Absolute: 0 10*3/uL (ref 0.0–0.1)
Basophils Relative: 0.4 % (ref 0.0–3.0)
EOS ABS: 0.1 10*3/uL (ref 0.0–0.7)
EOS PCT: 2.1 % (ref 0.0–5.0)
HEMATOCRIT: 46.3 % (ref 39.0–52.0)
HEMOGLOBIN: 15.7 g/dL (ref 13.0–17.0)
LYMPHS ABS: 1.7 10*3/uL (ref 0.7–4.0)
Lymphocytes Relative: 43.1 % (ref 12.0–46.0)
MCHC: 34 g/dL (ref 30.0–36.0)
MCV: 94.1 fl (ref 78.0–100.0)
MONO ABS: 0.3 10*3/uL (ref 0.1–1.0)
Monocytes Relative: 8.6 % (ref 3.0–12.0)
NEUTROS ABS: 1.8 10*3/uL (ref 1.4–7.7)
NEUTROS PCT: 45.8 % (ref 43.0–77.0)
Platelets: 194 10*3/uL (ref 150.0–400.0)
RBC: 4.92 Mil/uL (ref 4.22–5.81)
RDW: 13.4 % (ref 11.5–15.5)
WBC: 4 10*3/uL (ref 4.0–10.5)

## 2013-10-30 LAB — TSH: TSH: 0.93 u[IU]/mL (ref 0.35–4.50)

## 2013-10-30 LAB — HEPATIC FUNCTION PANEL
ALBUMIN: 4.8 g/dL (ref 3.5–5.2)
ALK PHOS: 50 U/L (ref 39–117)
ALT: 23 U/L (ref 0–53)
AST: 21 U/L (ref 0–37)
Bilirubin, Direct: 0.1 mg/dL (ref 0.0–0.3)
TOTAL PROTEIN: 7.2 g/dL (ref 6.0–8.3)
Total Bilirubin: 0.4 mg/dL (ref 0.2–1.2)

## 2013-10-30 LAB — URINALYSIS
BILIRUBIN URINE: NEGATIVE
HGB URINE DIPSTICK: NEGATIVE
Ketones, ur: NEGATIVE
LEUKOCYTES UA: NEGATIVE
NITRITE: NEGATIVE
Specific Gravity, Urine: 1.015 (ref 1.000–1.030)
Total Protein, Urine: NEGATIVE
Urine Glucose: NEGATIVE
Urobilinogen, UA: 0.2 (ref 0.0–1.0)
pH: 7.5 (ref 5.0–8.0)

## 2013-10-30 LAB — T4, FREE: Free T4: 0.75 ng/dL (ref 0.60–1.60)

## 2013-10-30 LAB — SEDIMENTATION RATE: Sed Rate: 2 mm/hr (ref 0–22)

## 2013-10-30 NOTE — Assessment & Plan Note (Addendum)
2015 Lipomas vs seb cysts: back, R knee Dr Denna Haggard - derm ref

## 2013-10-30 NOTE — Progress Notes (Signed)
   Subjective:    Patient ID: Jay Hall, male    DOB: 1975/07/06, 38 y.o.   MRN: [id number]  HPI  C/o wt loss: stopped drinking Pepsi    F/u toenail fungus coming back after nail loss/regrowth  Wt Readings from Last 3 Encounters:  10/30/13 182 lb (82.555 kg)  04/23/13 188 lb (85.276 kg)  07/30/12 194 lb (87.998 kg)   BP Readings from Last 3 Encounters:  10/30/13 122/80  04/23/13 120/76  07/30/12 130/98        Review of Systems  Constitutional: Negative for fatigue.  Respiratory: Negative for cough.   Cardiovascular: Negative for leg swelling.  Gastrointestinal: Negative for abdominal pain, diarrhea and abdominal distention.  Neurological: Negative for seizures.  Psychiatric/Behavioral: Negative for behavioral problems, sleep disturbance and decreased concentration. The patient is not nervous/anxious.        Objective:   Physical Exam  Constitutional: No distress. Looks well Pulmonary/Chest: No respiratory distress.  Heart RRR Abdominal: He exhibits no distension.  Musculoskeletal: He exhibits no edema.  Skin: finger wart, mild eczema. Soft i cm oval sq nodules - back, forearm Psychiatric: He has a normal mood and affect. Judgment normal.     Lab Results  Component Value Date   WBC 2.8* 01/09/2011   HGB 14.3 01/09/2011   HCT 41.6 01/09/2011   PLT 165.0 01/09/2011   CHOL 157 02/15/2010   TRIG 105.0 02/15/2010   HDL 46.80 02/15/2010   ALT 27 01/09/2011   AST 24 01/09/2011   NA 137 01/09/2011   K 3.9 01/09/2011   CL 102 01/09/2011   CREATININE 1.0 01/09/2011   BUN 16 01/09/2011   CO2 30 01/09/2011   TSH 1.25 12/06/2010   MICROALBUR 0.8 12/06/2010    Procedure Note :     Procedure : Cryosurgery   Indication:  Wart(s)    Risks including unsuccessful procedure , bleeding, infection, bruising, scar, a need for a repeat  procedure and others were explained to the patient in detail as well as the benefits. Informed consent was obtained verbally.    1  lesion(s)  on R ring finger   was/were treated with liquid nitrogen on a Q-tip in a usual fasion . Band-Aid was applied and antibiotic ointment was given for a later use.   Tolerated well. Complications none.   Postprocedure instructions :     Keep the wounds clean. You can wash them with liquid soap and water. Pat dry with gauze or a Kleenex tissue  Before applying antibiotic ointment and a Band-Aid.   You need to report immediately  if  any signs of infection develop.     Assessment & Plan:

## 2013-10-30 NOTE — Progress Notes (Signed)
Pre visit review using our clinic review tool, if applicable. No additional management support is needed unless otherwise documented below in the visit note. 

## 2013-10-30 NOTE — Patient Instructions (Signed)
Wt Readings from Last 3 Encounters:  10/30/13 182 lb (82.555 kg)  04/23/13 188 lb (85.276 kg)  07/30/12 194 lb (87.998 kg)

## 2013-10-30 NOTE — Assessment & Plan Note (Addendum)
5/15 - he is on a better diet now, no Pepsi Labs

## 2013-10-30 NOTE — Assessment & Plan Note (Signed)
Continue with current prescription therapy as reflected on the Med list.  

## 2013-10-30 NOTE — Assessment & Plan Note (Signed)
Cryo  

## 2014-04-23 ENCOUNTER — Ambulatory Visit (INDEPENDENT_AMBULATORY_CARE_PROVIDER_SITE_OTHER): Payer: BC Managed Care – PPO | Admitting: Adult Health

## 2014-04-23 ENCOUNTER — Encounter: Payer: Self-pay | Admitting: Adult Health

## 2014-04-23 VITALS — BP 133/88 | HR 100 | Ht 66.0 in | Wt 180.0 lb

## 2014-04-23 DIAGNOSIS — R569 Unspecified convulsions: Secondary | ICD-10-CM

## 2014-04-23 DIAGNOSIS — Z5181 Encounter for therapeutic drug level monitoring: Secondary | ICD-10-CM

## 2014-04-23 MED ORDER — DILANTIN 100 MG PO CAPS: 400.0000 mg | ORAL_CAPSULE | Freq: Every day | ORAL | Status: DC

## 2014-04-23 NOTE — Progress Notes (Signed)
PATIENT: Jay Hall DOB: 08-05-1975  REASON FOR VISIT: follow up HISTORY FROM: patient  HISTORY OF PRESENT ILLNESS: [name] is a 38 year old male with a history of seizures. He returns today for follow-up. He is currently taking Dilantin 400 mg daily. He reports that his last seizure was in 1998. 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 He has a full time job and is able to complete his duties without difficulty. No new neurological complaints.   HISTORY 04/23/13 (CM): 38 year old right-handed white male with a history of seizures dating back to adolescence. He was last seen 04/09/2012 by Dr. Jannifer Franklin The patient indicates that the last seizure was in 1998, and he has been on Brand Dilantin since that time. The last seizure occurred when he tried to taper off of his medications. The patient is on 400 mg of Dilantin daily, and he is tolerating this quite well. The patient is on vitamin D supplementation. The patient is operating motor vehicle, and he is actively working. Patient has no new neurologic complaints  REVIEW OF SYSTEMS: Full 14 system review of systems performed and notable only for:  Constitutional: N/A  Eyes: N/A Ear/Nose/Throat: N/A  Skin: N/A  Cardiovascular: N/A  Respiratory: N/A  Gastrointestinal: N/A  Genitourinary: N/A Hematology/Lymphatic: N/A  Endocrine: N/A Musculoskeletal:N/A  Allergy/Immunology: N/A  Neurological: N/A Psychiatric: N/A Sleep: N/A   ALLERGIES: No Known Allergies  HOME MEDICATIONS: Outpatient Prescriptions Prior to Visit  Medication Sig Dispense Refill  . DILANTIN 100 MG ER capsule Take 4 capsules (400 mg total) by mouth daily.  120 capsule  11  . cholecalciferol (VITAMIN D) 1000 UNITS tablet Take 1,000 Units by mouth as needed.       . fluticasone (CUTIVATE) 0.05 % cream Apply topically 2 (two) times daily.  60 g  1  . vitamin B-12 (CYANOCOBALAMIN) 500 MCG tablet Take 500  mcg by mouth daily.         No facility-administered medications prior to visit.    PAST MEDICAL HISTORY: Past Medical History  Diagnosis Date  . Epilepsy 1998    Dr. Cloretta Ned    PAST SURGICAL HISTORY: Past Surgical History  Procedure Laterality Date  . No past surgeries      FAMILY HISTORY: Family History  Problem Relation Age of Onset  . Hypertension Mother   . Cancer Father     Prostate  . Hypertension Father     SOCIAL HISTORY: History   Social History  . Marital Status: Married    Spouse Name: [name]    Number of Children: 2  . Years of Education: BA   Occupational History  . 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 Smoking status: Never Smoker   . Smokeless tobacco: Never Used  . Alcohol Use: Yes  . Drug Use: No  . Sexual Activity: Yes   Other Topics Concern  . Not on file   Social History Narrative   Regular exercise-no   Patient lives at home with with [name].    Patient has 2 children.    Patient has a BA degree.       PHYSICAL EXAM  Filed Vitals:   04/23/14 0921  BP: 133/88  Pulse: 100  Height: 5\' 6"  (1.676 m)  Weight: 180 lb (81.647 kg)   Body mass index is 29.07 kg/(m^2).  Generalized: Well developed, in no acute distress  Neurological examination  Mentation: Alert oriented to time, place, history taking. Follows all commands speech and language fluent Cranial nerve II-XII: Pupils were equal round reactive to light. Extraocular movements were full, visual field were full on confrontational test. Facial sensation and strength were normal. Uvula tongue midline. Head turning and shoulder shrug  were normal and symmetric. Motor: The motor testing reveals 5 over 5 strength of all 4 extremities. Good symmetric motor tone is noted throughout.  Sensory: Sensory testing is intact to soft touch on all 4 extremities. No evidence of extinction is noted.  Coordination: Cerebellar testing reveals  good finger-nose-finger and heel-to-shin bilaterally.  Gait and station: Gait is normal. Tandem gait is normal. Romberg is negative. No drift is seen.  Reflexes: Deep tendon reflexes are symmetric and normal bilaterally.     DIAGNOSTIC DATA (LABS, IMAGING, TESTING) - I reviewed patient records, labs, notes, testing and imaging myself where available.  Lab Results  Component Value Date   WBC 4.0 10/30/2013   HGB 15.7 10/30/2013   HCT 46.3 10/30/2013   MCV 94.1 10/30/2013   PLT 194.0 10/30/2013      Component Value Date/Time   NA 139 10/30/2013 0929   NA 137 04/23/2013 0942   K 4.4 10/30/2013 0929   CL 102 10/30/2013 0929   CO2 31 10/30/2013 0929   GLUCOSE 91 10/30/2013 0929   GLUCOSE 84 04/23/2013 0942   BUN 17 10/30/2013 0929   BUN 20 04/23/2013 0942   CREATININE 1.1 10/30/2013 0929   CALCIUM 9.4 10/30/2013 0929   PROT 7.2 10/30/2013 0929   PROT 7.0 04/23/2013 0942   ALBUMIN 4.8 10/30/2013 0929   AST 21 10/30/2013 0929   ALT 23 10/30/2013 0929   ALKPHOS 50 10/30/2013 0929   BILITOT 0.4 10/30/2013 0929   GFRNONAA 93 04/23/2013 0942   GFRAA 108 04/23/2013 0942   Lab Results  Component Value Date   CHOL 134 07/25/2012   HDL 38.80* 07/25/2012   LDLCALC 76 07/25/2012   TRIG 96.0 07/25/2012   CHOLHDL 3 07/25/2012    Lab Results  Component Value Date   VITAMINB12 241 02/15/2010   Lab Results  Component Value Date   TSH 0.93 10/30/2013      ASSESSMENT AND PLAN 38 y.o. year old male  has a past medical history of Epilepsy (1998). here with:  1. Seizures  Patient seizures are well controlled on Dilantin. Continue Dilantin, I will refill today Check blood work today. CBC, CMP and dilantin level Follow-up in 1 year or sooner if needed.   Ward Givens, MSN, NP-C 04/23/2014, 9:34 AM [facility] [address] [place], [place] [address] [phone]  Note: This document was prepared with digital dictation and possible smart phrase technology. Any transcriptional errors that  result from this process are unintentional.

## 2014-04-23 NOTE — Progress Notes (Signed)
I have read the note, and I agree with the clinical assessment and plan.  Nisaiah Bechtol KEITH   

## 2014-04-23 NOTE — Patient Instructions (Signed)
Seizure, Adult A seizure means there is unusual activity in the brain. A seizure can cause changes in attention or behavior. Seizures often cause shaking (convulsions). Seizures often last from 30 seconds to 2 minutes. HOME CARE   If you are given medicines, take them exactly as told by your doctor.  Keep all doctor visits as told.  Do not swim or drive until your doctor says it is okay.  Teach others what to do if you have a seizure. They should:  Lay you on the ground.  Put a cushion under your head.  Loosen any tight clothing around your neck.  Turn you on your side.  Stay with you until you get better. GET HELP RIGHT AWAY IF:   The seizure lasts longer than 2 to 5 minutes.  The seizure is very bad.  The person does not wake up after the seizure.  The person's attention or behavior changes. Drive the person to the emergency room or call your local emergency services (911 in [place] MAKE SURE YOU:   Understand these instructions.  Will watch your condition.  Will get help right away if you are not doing well or get worse. Document Released: 11/29/2007 Document Revised: 09/04/2011 Document Reviewed: 05/31/2011 Exit[employer] Patient Information 2015 [employer]. This information is not intended to replace advice given to you by your health care provider. Make sure you discuss any questions you have with your health care provider.  

## 2014-04-24 ENCOUNTER — Telehealth: Payer: Self-pay | Admitting: Adult Health

## 2014-04-24 LAB — COMPREHENSIVE METABOLIC PANEL
ALBUMIN: 5 g/dL (ref 3.5–5.5)
ALT: 23 IU/L (ref 0–44)
AST: 19 IU/L (ref 0–40)
Albumin/Globulin Ratio: 2.6 — ABNORMAL HIGH (ref 1.1–2.5)
Alkaline Phosphatase: 54 IU/L (ref 39–117)
BILIRUBIN TOTAL: 0.3 mg/dL (ref 0.0–1.2)
BUN/Creatinine Ratio: 15 (ref 8–19)
BUN: 15 mg/dL (ref 6–20)
CO2: 29 mmol/L (ref 18–29)
Calcium: 9.7 mg/dL (ref 8.7–10.2)
Chloride: 101 mmol/L (ref 97–108)
Creatinine, Ser: 1 mg/dL (ref 0.76–1.27)
GFR calc non Af Amer: 95 mL/min/{1.73_m2} (ref >59)
GFR, EST AFRICAN AMERICAN: 110 mL/min/{1.73_m2} (ref >59)
GLOBULIN, TOTAL: 1.9 g/dL (ref 1.5–4.5)
Glucose: 83 mg/dL (ref 65–99)
POTASSIUM: 4.8 mmol/L (ref 3.5–5.2)
Sodium: 141 mmol/L (ref 134–144)
Total Protein: 6.9 g/dL (ref 6.0–8.5)

## 2014-04-24 LAB — CBC WITH DIFFERENTIAL
BASOS: 0 %
Basophils Absolute: 0 10*3/uL (ref 0.0–0.2)
EOS ABS: 0 10*3/uL (ref 0.0–0.4)
Eos: 1 %
HEMATOCRIT: 45.8 % (ref 37.5–51.0)
Hemoglobin: 15.7 g/dL (ref 12.6–17.7)
Immature Grans (Abs): 0 10*3/uL (ref 0.0–0.1)
Immature Granulocytes: 0 %
LYMPHS ABS: 1.2 10*3/uL (ref 0.7–3.1)
Lymphs: 39 %
MCH: 31.4 pg (ref 26.6–33.0)
MCHC: 34.3 g/dL (ref 31.5–35.7)
MCV: 92 fL (ref 79–97)
Monocytes Absolute: 0.2 10*3/uL (ref 0.1–0.9)
Monocytes: 6 %
NEUTROS ABS: 1.7 10*3/uL (ref 1.4–7.0)
Neutrophils Relative %: 54 %
Platelets: 195 10*3/uL (ref 150–379)
RBC: 5 x10E6/uL (ref 4.14–5.80)
RDW: 13.6 % (ref 12.3–15.4)
WBC: 3.1 10*3/uL — ABNORMAL LOW (ref 3.4–10.8)

## 2014-04-24 LAB — PHENYTOIN LEVEL, TOTAL: Phenytoin Lvl: 25.5 ug/mL (ref 10.0–20.0)

## 2014-04-24 MED ORDER — DILANTIN 100 MG PO CAPS: 300.0000 mg | ORAL_CAPSULE | Freq: Every day | ORAL | Status: DC

## 2014-04-24 MED ORDER — PHENYTOIN 50 MG PO CHEW: 50.0000 mg | CHEWABLE_TABLET | Freq: Every day | ORAL | Status: DC

## 2014-04-24 NOTE — Telephone Encounter (Signed)
I called the patient. His Dilantin level is elevated. I will decrease him from 400 mg daily to 350 mg daily we will recheck  his Dilantin level in 3 weeks.

## 2014-05-05 ENCOUNTER — Ambulatory Visit: Payer: BC Managed Care – PPO | Admitting: Internal Medicine

## 2014-06-01 ENCOUNTER — Telehealth: Payer: Self-pay | Admitting: Adult Health

## 2014-06-01 DIAGNOSIS — Z5181 Encounter for therapeutic drug level monitoring: Secondary | ICD-10-CM

## 2014-06-01 NOTE — Telephone Encounter (Signed)
Called patient, LM with wife for him to come in for a dilantin recheck between  8-12 M-F and 1-3 M-Th

## 2014-06-01 NOTE — Telephone Encounter (Signed)
Please call the patient and have him come in at his convenience to have blood work completed to recheck the Dilantin level.

## 2014-06-04 ENCOUNTER — Other Ambulatory Visit (INDEPENDENT_AMBULATORY_CARE_PROVIDER_SITE_OTHER): Payer: Self-pay

## 2014-06-04 DIAGNOSIS — Z0289 Encounter for other administrative examinations: Secondary | ICD-10-CM

## 2014-06-04 DIAGNOSIS — Z5181 Encounter for therapeutic drug level monitoring: Secondary | ICD-10-CM

## 2014-06-05 LAB — PHENYTOIN LEVEL, TOTAL: Phenytoin Lvl: 11.8 ug/mL (ref 10.0–20.0)

## 2014-06-05 NOTE — Progress Notes (Signed)
Called patient and advised of normal Dilantin level.

## 2014-08-14 ENCOUNTER — Other Ambulatory Visit: Payer: Self-pay | Admitting: Adult Health

## 2015-03-13 ENCOUNTER — Other Ambulatory Visit: Payer: Self-pay | Admitting: Adult Health

## 2015-04-26 ENCOUNTER — Ambulatory Visit: Payer: BC Managed Care – PPO | Admitting: Adult Health

## 2015-04-29 ENCOUNTER — Encounter: Payer: Self-pay | Admitting: Adult Health

## 2015-04-29 ENCOUNTER — Ambulatory Visit (INDEPENDENT_AMBULATORY_CARE_PROVIDER_SITE_OTHER): Payer: BLUE CROSS/BLUE SHIELD | Admitting: Adult Health

## 2015-04-29 VITALS — BP 125/72 | HR 95 | Ht 72.0 in | Wt 182.0 lb

## 2015-04-29 DIAGNOSIS — Z5181 Encounter for therapeutic drug level monitoring: Secondary | ICD-10-CM | POA: Diagnosis not present

## 2015-04-29 DIAGNOSIS — R569 Unspecified convulsions: Secondary | ICD-10-CM

## 2015-04-29 NOTE — Progress Notes (Signed)
PATIENT: Jay Hall DOB: 05-21-1976  REASON FOR VISIT: follow up-seizures HISTORY FROM: patient  HISTORY OF PRESENT ILLNESS: Jay Hall is a 39 year old male with a history of seizure events. He returns today for follow-up. He is currently taking Dilantin 350 mg daily. Patient reports that he has not had any seizure events. 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 Denies any changes with his gait or balance. He does follow up with regular dental appointments. He denies any new medical issues. He returns today for follow-up.  HISTORY  04/23/14: Jay Hall is a 39 year old male with a history of seizures. He returns today for follow-up. He is currently taking Dilantin 400 mg daily. He reports that his last seizure was in 1998. 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 He has a full time job and is able to complete his duties without difficulty. No new neurological complaints.   HISTORY 04/23/13 (CM): 39 year old right-handed white male with a history of seizures dating back to adolescence. He was last seen 04/09/2012 by Jay Hall The patient indicates that the last seizure was in 1998, and he has been on Brand Dilantin since that time. The last seizure occurred when he tried to taper off of his medications. The patient is on 400 mg of Dilantin daily, and he is tolerating this quite well. The patient is on vitamin D supplementation. The patient is operating motor vehicle, and he is actively working. Patient has no new neurologic complaints  REVIEW OF SYSTEMS: Out of a complete 14 system review of symptoms, the patient complains only of the following symptoms, and all other reviewed systems are negative.  See history of present illness  ALLERGIES: No Known Allergies  HOME MEDICATIONS: Outpatient Prescriptions Prior to Visit  Medication Sig Dispense Refill  .  cholecalciferol (VITAMIN D) 1000 UNITS tablet Take 1,000 Units by mouth as needed.     Marland Kitchen DILANTIN 100 MG ER capsule Take 3 capsules (300 mg total) by mouth daily. 120 capsule 11  . DILANTIN INFATABS 50 MG tablet CHEW 1 TABLET (50 MG TOTAL) BY MOUTH DAILY. 30 tablet 6   No facility-administered medications prior to visit.    PAST MEDICAL HISTORY: Past Medical History  Diagnosis Date  . Epilepsy [facility]) 1998    Dr. Cloretta Ned    PAST SURGICAL HISTORY: Past Surgical History  Procedure Laterality Date  . No past surgeries      FAMILY HISTORY: Family History  Problem Relation Age of Onset  . Hypertension Mother   . Cancer Father     Prostate  . Hypertension Father     SOCIAL HISTORY: Social History   Social History  . Marital Status: Married    Spouse Name: Jay Hall  . Number of Children: 2  . Years of Education: BA   Occupational History  . 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 Smoking status: Never Smoker   . Smokeless tobacco: Never Used  . Alcohol Use: Yes  . Drug Use: No  . Sexual Activity: Yes   Other Topics Concern  . Not on file   Social History Narrative   Regular exercise-no   Patient lives at home with with Jay Hall.    Patient has 2 children.    Patient has a BA degree.       PHYSICAL EXAM  Filed Vitals:  04/29/15 0759  BP: 125/72  Pulse: 95  Height: 6' (1.829 m)  Weight: 182 lb (82.555 kg)   Body mass index is 24.68 kg/(m^2).  Generalized: Well developed, in no acute distress   Neurological examination  Mentation: Alert oriented to time, place, history taking. Follows all commands speech and language fluent Cranial nerve II-XII: Pupils were equal round reactive to light. Extraocular movements were full, visual field were full on confrontational test. Facial sensation and strength were normal. Uvula tongue midline. Head turning and shoulder shrug  were normal and symmetric. Motor: The motor  testing reveals 5 over 5 strength of all 4 extremities. Good symmetric motor tone is noted throughout.  Sensory: Sensory testing is intact to soft touch on all 4 extremities. No evidence of extinction is noted.  Coordination: Cerebellar testing reveals good finger-nose-finger and heel-to-shin bilaterally.  Gait and station: Gait is normal. Tandem gait is normal. Romberg is negative. No drift is seen.  Reflexes: Deep tendon reflexes are symmetric and normal bilaterally.   DIAGNOSTIC DATA (LABS, IMAGING, TESTING) - I reviewed patient records, labs, notes, testing and imaging myself where available.  Lab Results  Component Value Date   WBC 3.1* 04/23/2014   HGB 15.7 04/23/2014   HCT 45.8 04/23/2014   MCV 92 04/23/2014   PLT 195 04/23/2014      Component Value Date/Time   NA 141 04/23/2014 1004   NA 139 10/30/2013 0929   K 4.8 04/23/2014 1004   CL 101 04/23/2014 1004   CO2 29 04/23/2014 1004   GLUCOSE 83 04/23/2014 1004   GLUCOSE 91 10/30/2013 0929   BUN 15 04/23/2014 1004   BUN 17 10/30/2013 0929   CREATININE 1.00 04/23/2014 1004   CALCIUM 9.7 04/23/2014 1004   PROT 6.9 04/23/2014 1004   PROT 7.2 10/30/2013 0929   ALBUMIN 5.0 04/23/2014 1004   ALBUMIN 4.8 10/30/2013 0929   AST 19 04/23/2014 1004   ALT 23 04/23/2014 1004   ALKPHOS 54 04/23/2014 1004   BILITOT 0.3 04/23/2014 1004   GFRNONAA 95 04/23/2014 1004   GFRAA 110 04/23/2014 1004    ASSESSMENT AND PLAN 39 y.o. year old male  has a past medical history of Epilepsy ([place]) (1998). here with:  1. Seizure events  Overall the patient is doing well. He will continue on Dilantin 350 mg daily. I will check blood work today. If he has any seizure events he should let us know. He will follow-up in one year with Jay Hall.   Ward Givens, MSN, NP-C 04/29/2015, 8:26 AM [facility] [address] [place], [place] [address] [phone]

## 2015-04-29 NOTE — Progress Notes (Signed)
I have read the note, and I agree with the clinical assessment and plan.  WILLIS,CHARLES KEITH   

## 2015-04-29 NOTE — Patient Instructions (Signed)
Continue Dilantin  I will check blood work today If you have any seizure events please let us know.

## 2015-04-30 LAB — CBC WITH DIFFERENTIAL/PLATELET
Basophils Absolute: 0 10*3/uL (ref 0.0–0.2)
Basos: 0 %
EOS (ABSOLUTE): 0.1 10*3/uL (ref 0.0–0.4)
EOS: 2 %
HEMATOCRIT: 43.8 % (ref 37.5–51.0)
HEMOGLOBIN: 14.8 g/dL (ref 12.6–17.7)
IMMATURE GRANS (ABS): 0 10*3/uL (ref 0.0–0.1)
Immature Granulocytes: 0 %
Lymphocytes Absolute: 1.3 10*3/uL (ref 0.7–3.1)
Lymphs: 36 %
MCH: 31.6 pg (ref 26.6–33.0)
MCHC: 33.8 g/dL (ref 31.5–35.7)
MCV: 94 fL (ref 79–97)
Monocytes Absolute: 0.4 10*3/uL (ref 0.1–0.9)
Monocytes: 11 %
Neutrophils Absolute: 1.9 10*3/uL (ref 1.4–7.0)
Neutrophils: 51 %
Platelets: 183 10*3/uL (ref 150–379)
RBC: 4.68 x10E6/uL (ref 4.14–5.80)
RDW: 13.9 % (ref 12.3–15.4)
WBC: 3.7 10*3/uL (ref 3.4–10.8)

## 2015-04-30 LAB — COMPREHENSIVE METABOLIC PANEL
ALBUMIN: 4.6 g/dL (ref 3.5–5.5)
ALT: 20 IU/L (ref 0–44)
AST: 20 IU/L (ref 0–40)
Albumin/Globulin Ratio: 2.1 (ref 1.1–2.5)
Alkaline Phosphatase: 64 IU/L (ref 39–117)
BILIRUBIN TOTAL: 0.3 mg/dL (ref 0.0–1.2)
BUN/Creatinine Ratio: 15 (ref 8–19)
BUN: 14 mg/dL (ref 6–20)
CHLORIDE: 100 mmol/L (ref 97–106)
CO2: 30 mmol/L — ABNORMAL HIGH (ref 18–29)
CREATININE: 0.94 mg/dL (ref 0.76–1.27)
Calcium: 9.3 mg/dL (ref 8.7–10.2)
GFR calc Af Amer: 118 mL/min/{1.73_m2} (ref >59)
GFR calc non Af Amer: 102 mL/min/{1.73_m2} (ref >59)
GLOBULIN, TOTAL: 2.2 g/dL (ref 1.5–4.5)
Glucose: 83 mg/dL (ref 65–99)
Potassium: 4.6 mmol/L (ref 3.5–5.2)
SODIUM: 141 mmol/L (ref 136–144)
Total Protein: 6.8 g/dL (ref 6.0–8.5)

## 2015-04-30 LAB — PHENYTOIN LEVEL, TOTAL: PHENYTOIN (DILANTIN), SERUM: 15.2 ug/mL (ref 10.0–20.0)

## 2015-05-03 ENCOUNTER — Telehealth: Payer: Self-pay

## 2015-05-03 NOTE — Telephone Encounter (Signed)
Advised pt that his labs were normal. Pt verbalized understanding.

## 2015-05-03 NOTE — Telephone Encounter (Signed)
-----   Message from Ward Givens, NP sent at 05/03/2015  7:40 AM EST ----- Lab work normal. Please call patient.

## 2015-05-27 ENCOUNTER — Encounter: Payer: Self-pay | Admitting: Internal Medicine

## 2015-05-27 ENCOUNTER — Ambulatory Visit (INDEPENDENT_AMBULATORY_CARE_PROVIDER_SITE_OTHER): Payer: BLUE CROSS/BLUE SHIELD | Admitting: Internal Medicine

## 2015-05-27 VITALS — BP 118/80 | HR 80 | Ht 72.0 in | Wt 180.0 lb

## 2015-05-27 DIAGNOSIS — R569 Unspecified convulsions: Secondary | ICD-10-CM | POA: Diagnosis not present

## 2015-05-27 DIAGNOSIS — R000 Tachycardia, unspecified: Secondary | ICD-10-CM | POA: Insufficient documentation

## 2015-05-27 DIAGNOSIS — Z0000 Encounter for general adult medical examination without abnormal findings: Secondary | ICD-10-CM

## 2015-05-27 NOTE — Assessment & Plan Note (Signed)
We discussed age appropriate health related issues, including available/recomended screening tests and vaccinations. We discussed a need for adhering to healthy diet and exercise. Labs/EKG were reviewed/ordered. All questions were answered.   

## 2015-05-27 NOTE — Progress Notes (Signed)
Pre visit review using our clinic review tool, if applicable. No additional management support is needed unless otherwise documented below in the visit note. 

## 2015-05-27 NOTE — Assessment & Plan Note (Signed)
EKG NSR HR 80 Start exercising

## 2015-05-27 NOTE — Progress Notes (Signed)
Subjective:  Patient ID: Jay Hall, male    DOB: [birth year]  Age: 39 y.o. MRN: [id number]  CC: Annual Exam   HPI Jay Hall presents for a well exam  Outpatient Prescriptions Prior to Visit  Medication Sig Dispense Refill  . cholecalciferol (VITAMIN D) 1000 UNITS tablet Take 1,000 Units by mouth as needed.     Marland Kitchen DILANTIN 100 MG ER capsule Take 3 capsules (300 mg total) by mouth daily. 120 capsule 11  . DILANTIN INFATABS 50 MG tablet CHEW 1 TABLET (50 MG TOTAL) BY MOUTH DAILY. 30 tablet 6   No facility-administered medications prior to visit.    ROS Review of Systems  Constitutional: Negative for appetite change, fatigue and unexpected weight change.  HENT: Negative for congestion, nosebleeds, sneezing, sore throat and trouble swallowing.   Eyes: Negative for itching and visual disturbance.  Respiratory: Negative for cough and chest tightness.   Cardiovascular: Negative for chest pain, palpitations and leg swelling.  Gastrointestinal: Negative for nausea, diarrhea, blood in stool and abdominal distention.  Genitourinary: Negative for frequency and hematuria.  Musculoskeletal: Negative for back pain, joint swelling, gait problem and neck pain.  Skin: Negative for rash.  Neurological: Negative for dizziness, tremors, speech difficulty and weakness.  Psychiatric/Behavioral: Negative for sleep disturbance, dysphoric mood and agitation. The patient is not nervous/anxious.     Objective:  BP 118/80 mmHg  Pulse 102  Ht 6' (1.829 m)  Wt 180 lb (81.647 kg)  BMI 24.41 kg/m2  SpO2 98%  BP Readings from Last 3 Encounters:  05/27/15 118/80  04/29/15 125/72  04/23/14 133/88    Wt Readings from Last 3 Encounters:  05/27/15 180 lb (81.647 kg)  04/29/15 182 lb (82.555 kg)  04/23/14 180 lb (81.647 kg)    Physical Exam  Constitutional: He is oriented to person, place, and time. He appears well-developed. No distress.  NAD  HENT:  Mouth/Throat: Oropharynx is  clear and moist.  Eyes: Conjunctivae are normal. Pupils are equal, round, and reactive to light.  Neck: Normal range of motion. No JVD present. No thyromegaly present.  Cardiovascular: Normal rate, regular rhythm, normal heart sounds and intact distal pulses.  Exam reveals no gallop and no friction rub.   No murmur heard. Pulmonary/Chest: Effort normal and breath sounds normal. No respiratory distress. He has no wheezes. He has no rales. He exhibits no tenderness.  Abdominal: Soft. Bowel sounds are normal. He exhibits no distension and no mass. There is no tenderness. There is no rebound and no guarding.  Musculoskeletal: Normal range of motion. He exhibits no edema or tenderness.  Lymphadenopathy:    He has no cervical adenopathy.  Neurological: He is alert and oriented to person, place, and time. He has normal reflexes. No cranial nerve deficit. He exhibits normal muscle tone. He displays a negative Romberg sign. Coordination and gait normal.  Skin: Skin is warm and dry. No rash noted.  Psychiatric: He has a normal mood and affect. His behavior is normal. Judgment and thought content normal.  B testes nl  Lab Results  Component Value Date   WBC 3.7 04/29/2015   HGB 15.7 04/23/2014   HCT 43.8 04/29/2015   PLT 195 04/23/2014   GLUCOSE 83 04/29/2015   CHOL 134 07/25/2012   TRIG 96.0 07/25/2012   HDL 38.80* 07/25/2012   LDLCALC 76 07/25/2012   ALT 20 04/29/2015   AST 20 04/29/2015   NA 141 04/29/2015   K 4.6 04/29/2015   CL 100 04/29/2015  CREATININE 0.94 04/29/2015   BUN 14 04/29/2015   CO2 30* 04/29/2015   TSH 0.93 10/30/2013   MICROALBUR 0.8 12/06/2010   EKG NSR HR 83 No results found.  Assessment & Plan:   Jay Hall was seen today for annual exam.  Diagnoses and all orders for this visit:  Well adult exam -     EKG 12-Lead -     Hepatic function panel; Future -     TSH; Future -     Lipid panel; Future -     Urinalysis; Future   I am having Jay Hall maintain  his cholecalciferol, DILANTIN, DILANTIN INFATABS, JUBLIA, and fluticasone.  Meds ordered this encounter  Medications  . JUBLIA 10 % SOLN    Sig: APPLY TO TOENAIL EVERY DAY AFTER BATHING    Refill:  1  . fluticasone (CUTIVATE) 0.05 % cream    Sig:      Follow-up: Return in about 1 year (around 05/26/2016) for Wellness Exam.  Walker Kehr, MD

## 2015-05-27 NOTE — Assessment & Plan Note (Signed)
No relapse. Dilantin level is ok

## 2015-06-16 ENCOUNTER — Other Ambulatory Visit: Payer: Self-pay | Admitting: Adult Health

## 2015-06-16 ENCOUNTER — Other Ambulatory Visit (INDEPENDENT_AMBULATORY_CARE_PROVIDER_SITE_OTHER): Payer: BLUE CROSS/BLUE SHIELD

## 2015-06-16 DIAGNOSIS — Z0000 Encounter for general adult medical examination without abnormal findings: Secondary | ICD-10-CM | POA: Diagnosis not present

## 2015-06-16 LAB — LIPID PANEL
CHOL/HDL RATIO: 3
CHOLESTEROL: 140 mg/dL (ref 0–200)
HDL: 45.4 mg/dL (ref >39.00)
LDL Cholesterol: 79 mg/dL (ref 0–99)
NonHDL: 94.43
TRIGLYCERIDES: 78 mg/dL (ref 0.0–149.0)
VLDL: 15.6 mg/dL (ref 0.0–40.0)

## 2015-06-16 LAB — HEPATIC FUNCTION PANEL
ALBUMIN: 4.4 g/dL (ref 3.5–5.2)
ALK PHOS: 53 U/L (ref 39–117)
ALT: 25 U/L (ref 0–53)
AST: 19 U/L (ref 0–37)
Bilirubin, Direct: 0.1 mg/dL (ref 0.0–0.3)
Total Bilirubin: 0.4 mg/dL (ref 0.2–1.2)
Total Protein: 6.9 g/dL (ref 6.0–8.3)

## 2015-06-16 LAB — URINALYSIS
BILIRUBIN URINE: NEGATIVE
Hgb urine dipstick: NEGATIVE
KETONES UR: NEGATIVE
LEUKOCYTES UA: NEGATIVE
NITRITE: NEGATIVE
PH: 6 (ref 5.0–8.0)
SPECIFIC GRAVITY, URINE: 1.02 (ref 1.000–1.030)
Total Protein, Urine: NEGATIVE
UROBILINOGEN UA: 0.2 (ref 0.0–1.0)
Urine Glucose: NEGATIVE

## 2015-06-16 LAB — TSH: TSH: 1.13 u[IU]/mL (ref 0.35–4.50)

## 2015-06-16 NOTE — Telephone Encounter (Signed)
Pt called in to see if the request for medication was going to be sent to the pharm today.

## 2015-07-22 ENCOUNTER — Encounter: Payer: Self-pay | Admitting: Internal Medicine

## 2015-07-27 ENCOUNTER — Telehealth: Payer: Self-pay | Admitting: Internal Medicine

## 2015-07-27 NOTE — Telephone Encounter (Signed)
Pt sent an email to Dr. Camila Li stating he is leaving the country and is requesting some Ambien to help sleep because of the jet lag  Pharmacy is [facility] on [address]

## 2015-07-28 MED ORDER — ZOLPIDEM TARTRATE 5 MG PO TABS: 5.0000 mg | ORAL_TABLET | Freq: Every evening | ORAL | Status: DC | PRN

## 2015-07-28 NOTE — Telephone Encounter (Signed)
Pt called again to follow-up on this.

## 2015-07-28 NOTE — Telephone Encounter (Signed)
Ok Pls call in - see Rx Thx

## 2015-07-29 NOTE — Telephone Encounter (Signed)
Patient states pharmacy did not received called in script.  Can you please resend.

## 2015-07-29 NOTE — Telephone Encounter (Signed)
Pt informed

## 2015-07-29 NOTE — Telephone Encounter (Signed)
Rx phoned in. Pt informed.

## 2015-08-10 ENCOUNTER — Telehealth: Payer: Self-pay | Admitting: Adult Health

## 2015-08-10 DIAGNOSIS — Z5181 Encounter for therapeutic drug level monitoring: Secondary | ICD-10-CM

## 2015-08-10 NOTE — Telephone Encounter (Signed)
Pt called and is requesting to come in for Dilantin levels. He is having some light sensitivity and says this happened the last time. Please call and advise [phone]

## 2015-08-11 NOTE — Telephone Encounter (Signed)
I called pt and he will come in around 0800 or so and get trough level.  I gave hours.  He verbalized understanding.

## 2015-08-11 NOTE — Telephone Encounter (Signed)
Last seen 04-29-15, Dilantin level 15.2.

## 2015-08-11 NOTE — Telephone Encounter (Signed)
Order placed. He can come in to have this checked. If he takes his medication in the morning he should hold his medication until after he has his blood work. Its ideal if he can come in at 8:00 so his medication is no prolonged.

## 2015-08-13 ENCOUNTER — Other Ambulatory Visit (INDEPENDENT_AMBULATORY_CARE_PROVIDER_SITE_OTHER): Payer: Self-pay

## 2015-08-13 DIAGNOSIS — Z0289 Encounter for other administrative examinations: Secondary | ICD-10-CM

## 2015-08-13 DIAGNOSIS — Z5181 Encounter for therapeutic drug level monitoring: Secondary | ICD-10-CM

## 2015-08-14 LAB — PHENYTOIN LEVEL, TOTAL: Phenytoin (Dilantin), Serum: 7.7 ug/mL — ABNORMAL LOW (ref 10.0–20.0)

## 2015-08-16 ENCOUNTER — Telehealth: Payer: Self-pay | Admitting: Adult Health

## 2015-08-16 NOTE — Telephone Encounter (Signed)
No seizures or misses of medications per pt.  Will monitor photosensitivity the next week and will touch base next week.  No changes in medication at this time.

## 2015-08-16 NOTE — Telephone Encounter (Signed)
Pt called inquiring about lab results dated 08/13/15. He saw results from portal but would like clarification

## 2015-08-16 NOTE — Telephone Encounter (Signed)
-----   Message from Ward Givens, NP sent at 08/16/2015  7:39 AM EST ----- Dilantin level is low- has he had any seizure events? Please call the patient.

## 2015-08-17 ENCOUNTER — Encounter: Payer: Self-pay | Admitting: Adult Health

## 2015-08-17 DIAGNOSIS — Z5181 Encounter for therapeutic drug level monitoring: Secondary | ICD-10-CM

## 2015-08-17 DIAGNOSIS — Z79899 Other long term (current) drug therapy: Principal | ICD-10-CM

## 2015-08-17 NOTE — Telephone Encounter (Signed)
I called the patient. I verified that the patient has not missed any of his Dilantin doses. There is been no change in his pill type. He has not started any additional medication. I consulted with Dr. Jannifer Franklin. We will recheck a Dilantin level in 2 weeks. If his Dilantin remains low we will potentially increase his dosage. The patient is amenable to this plan. He will come in in 2 weeks to have his blood work checked.

## 2015-08-31 ENCOUNTER — Telehealth: Payer: Self-pay | Admitting: Adult Health

## 2015-08-31 NOTE — Telephone Encounter (Signed)
Please call the patient and have him come in for blood work. His Dilantin level needs to be rechecked. Order is already placed.

## 2015-09-01 ENCOUNTER — Other Ambulatory Visit (INDEPENDENT_AMBULATORY_CARE_PROVIDER_SITE_OTHER): Payer: Self-pay

## 2015-09-01 DIAGNOSIS — Z5181 Encounter for therapeutic drug level monitoring: Secondary | ICD-10-CM

## 2015-09-01 DIAGNOSIS — Z79899 Other long term (current) drug therapy: Principal | ICD-10-CM

## 2015-09-01 DIAGNOSIS — Z0289 Encounter for other administrative examinations: Secondary | ICD-10-CM

## 2015-09-01 NOTE — Telephone Encounter (Signed)
Left message on machine for patient. He has appointment for today to have blood work.

## 2015-09-02 ENCOUNTER — Telehealth: Payer: Self-pay | Admitting: Adult Health

## 2015-09-02 LAB — PHENYTOIN LEVEL, TOTAL: PHENYTOIN (DILANTIN), SERUM: 10.9 ug/mL (ref 10.0–20.0)

## 2015-09-02 NOTE — Telephone Encounter (Signed)
I called the patient his Dilantin level is in normal range. The patient states that his light sensitivity has improved. For now the patient would like to stay on his current dose of Dilantin. I explained that if his symptoms worsen or he had any seizure events he should let us know. He will follow-up in October for his yearly visit with Dr. Jannifer Franklin.

## 2015-10-14 ENCOUNTER — Other Ambulatory Visit: Payer: Self-pay | Admitting: Adult Health

## 2016-04-25 ENCOUNTER — Ambulatory Visit (INDEPENDENT_AMBULATORY_CARE_PROVIDER_SITE_OTHER): Payer: BLUE CROSS/BLUE SHIELD | Admitting: Neurology

## 2016-04-25 ENCOUNTER — Encounter: Payer: Self-pay | Admitting: Neurology

## 2016-04-25 VITALS — BP 132/84 | HR 68 | Ht 72.0 in | Wt 179.5 lb

## 2016-04-25 DIAGNOSIS — Z5181 Encounter for therapeutic drug level monitoring: Secondary | ICD-10-CM

## 2016-04-25 DIAGNOSIS — R569 Unspecified convulsions: Secondary | ICD-10-CM

## 2016-04-25 MED ORDER — PHENYTOIN 50 MG PO CHEW: CHEWABLE_TABLET | ORAL | 3 refills | Status: DC

## 2016-04-25 MED ORDER — DILANTIN 100 MG PO CAPS
300.0000 mg | ORAL_CAPSULE | Freq: Every day | ORAL | 3 refills | Status: DC
Start: 2016-04-25 — End: 2016-07-17

## 2016-04-25 NOTE — Progress Notes (Signed)
    Reason for visit: Seizures  Jay Hall is an 40 y.o. male  History of present illness:  Jay Hall is a 40 year old right-handed white male with a history of seizures that have been well controlled. The patient has not had a seizure since 1998. He has been on Dilantin taking 350 mg a day, he denies any side effects on the medication. He is on vitamin D supplementation. He denies any new medical issues that have come up since last seen. The patient is operating a motor vehicle without difficulty. He returns for an evaluation.  Past Medical History:  Diagnosis Date  . Epilepsy [facility]) 1998   Dr. Cloretta Ned    Past Surgical History:  Procedure Laterality Date  . NO PAST SURGERIES      Family History  Problem Relation Age of Onset  . Hypertension Mother   . Cancer Father     Prostate  . Hypertension Father     Social history:  reports that he has never smoked. He has never used smokeless tobacco. He reports that he drinks alcohol. He reports that he does not use drugs.   No Known Allergies  Medications:  Prior to Admission medications   Medication Sig Start Date End Date Taking? Authorizing Provider  cholecalciferol (VITAMIN D) 1000 UNITS tablet Take 1,000 Units by mouth as needed.    Yes Historical Provider, MD  DILANTIN 100 MG ER capsule Take 3 capsules (300 mg total) by mouth daily. 06/16/15  Yes Megan Clabe Seal, NP  DILANTIN INFATABS 50 MG tablet CHEW 1 TABLET (50 MG TOTAL) BY MOUTH DAILY. 10/14/15  Yes Kathrynn Ducking, MD  JUBLIA 10 % SOLN APPLY TO TOENAIL EVERY DAY AFTER BATHING 05/10/15  Yes Historical Provider, MD    ROS:  Out of a complete 14 system review of symptoms, the patient complains only of the following symptoms, and all other reviewed systems are negative.  History of seizures  Blood pressure 132/84, pulse 68, height 6' (1.829 m), weight 179 lb 8 oz (81.4 kg).  Physical Exam  General: The patient is alert and cooperative at the time of  the examination.  Skin: No significant peripheral edema is noted.   Neurologic Exam  Mental status: The patient is alert and oriented x 3 at the time of the examination. The patient has apparent normal recent and remote memory, with an apparently normal attention span and concentration ability.   Cranial nerves: Facial symmetry is present. Speech is normal, no aphasia or dysarthria is noted. Extraocular movements are full. Visual fields are full.  Motor: The patient has good strength in all 4 extremities.  Sensory examination: Soft touch sensation is symmetric on the face, arms, and legs.  Coordination: The patient has good finger-nose-finger and heel-to-shin bilaterally.  Gait and station: The patient has a normal gait. Tandem gait is normal. Romberg is negative. No drift is seen.  Reflexes: Deep tendon reflexes are symmetric.   Assessment/Plan:  1. History of seizures, well controlled  Blood work will be done today. A prescription was given for is Dilantin for the 50 mg tablet and for the 300 mg capsule for a 90 day supply, 3 refills. He will follow-up in one year, sooner if needed.  Jill Alexanders MD 04/25/2016 8:26 AM  [facility] [address] [place], [place] [address]  Phone [phone] Fax [phone]

## 2016-04-26 LAB — COMPREHENSIVE METABOLIC PANEL
A/G RATIO: 2.1 (ref 1.2–2.2)
ALT: 18 IU/L (ref 0–44)
AST: 20 IU/L (ref 0–40)
Albumin: 4.6 g/dL (ref 3.5–5.5)
Alkaline Phosphatase: 54 IU/L (ref 39–117)
BUN/Creatinine Ratio: 20 (ref 9–20)
BUN: 18 mg/dL (ref 6–24)
Bilirubin Total: <0.2 mg/dL (ref 0.0–1.2)
CALCIUM: 9.3 mg/dL (ref 8.7–10.2)
CO2: 28 mmol/L (ref 18–29)
Chloride: 104 mmol/L (ref 96–106)
Creatinine, Ser: 0.91 mg/dL (ref 0.76–1.27)
GFR, EST AFRICAN AMERICAN: 121 mL/min/{1.73_m2} (ref >59)
GFR, EST NON AFRICAN AMERICAN: 105 mL/min/{1.73_m2} (ref >59)
GLUCOSE: 81 mg/dL (ref 65–99)
Globulin, Total: 2.2 g/dL (ref 1.5–4.5)
Potassium: 4.8 mmol/L (ref 3.5–5.2)
Sodium: 144 mmol/L (ref 134–144)
TOTAL PROTEIN: 6.8 g/dL (ref 6.0–8.5)

## 2016-04-26 LAB — PHENYTOIN LEVEL, TOTAL: PHENYTOIN (DILANTIN), SERUM: 12.5 ug/mL (ref 10.0–20.0)

## 2016-04-26 LAB — CBC WITH DIFFERENTIAL/PLATELET
BASOS: 0 %
Basophils Absolute: 0 10*3/uL (ref 0.0–0.2)
EOS (ABSOLUTE): 0.1 10*3/uL (ref 0.0–0.4)
Eos: 4 %
HEMOGLOBIN: 14.9 g/dL (ref 12.6–17.7)
Hematocrit: 43.7 % (ref 37.5–51.0)
IMMATURE GRANS (ABS): 0 10*3/uL (ref 0.0–0.1)
Immature Granulocytes: 0 %
LYMPHS: 44 %
Lymphocytes Absolute: 1.4 10*3/uL (ref 0.7–3.1)
MCH: 31.5 pg (ref 26.6–33.0)
MCHC: 34.1 g/dL (ref 31.5–35.7)
MCV: 92 fL (ref 79–97)
MONOCYTES: 9 %
Monocytes Absolute: 0.3 10*3/uL (ref 0.1–0.9)
NEUTROS ABS: 1.3 10*3/uL — AB (ref 1.4–7.0)
Neutrophils: 43 %
Platelets: 174 10*3/uL (ref 150–379)
RBC: 4.73 x10E6/uL (ref 4.14–5.80)
RDW: 13.8 % (ref 12.3–15.4)
WBC: 3.1 10*3/uL — ABNORMAL LOW (ref 3.4–10.8)

## 2016-06-11 ENCOUNTER — Other Ambulatory Visit: Payer: Self-pay | Admitting: Adult Health

## 2016-07-17 ENCOUNTER — Telehealth: Payer: Self-pay | Admitting: Neurology

## 2016-07-17 ENCOUNTER — Telehealth: Payer: Self-pay

## 2016-07-17 ENCOUNTER — Other Ambulatory Visit: Payer: Self-pay

## 2016-07-17 MED ORDER — PHENYTOIN 50 MG PO CHEW: CHEWABLE_TABLET | ORAL | 3 refills | Status: DC

## 2016-07-17 MED ORDER — DILANTIN 100 MG PO CAPS: 300.0000 mg | ORAL_CAPSULE | Freq: Every day | ORAL | 3 refills | Status: DC

## 2016-07-17 NOTE — Addendum Note (Signed)
Addended by: Monte Fantasia on: 07/17/2016 11:12 AM   Modules accepted: Orders

## 2016-07-17 NOTE — Telephone Encounter (Signed)
Refills e-scribed to mail order pharmacy as requested.

## 2016-07-17 NOTE — Telephone Encounter (Signed)
Rx re-sent for brand name only per pt request.

## 2016-07-17 NOTE — Telephone Encounter (Signed)
phenytoin (DILANTIN INFATABS) 50 MG tablet patient received this medication he does not want the generic.

## 2016-07-17 NOTE — Telephone Encounter (Signed)
Patient requesting refill for DILANTIN 100 MG ER capsule and phenytoin (DILANTIN INFATABS) 50 MG tablet.  [facility]

## 2016-07-19 MED ORDER — DILANTIN INFATABS 50 MG PO CHEW: CHEWABLE_TABLET | ORAL | 3 refills | Status: DC

## 2016-07-19 NOTE — Telephone Encounter (Signed)
Pt called said pharmacy has advised phenytoin (DILANTIN INFATABS) 50 MG tablet should read dispense as written not brand only

## 2016-07-19 NOTE — Telephone Encounter (Signed)
Refills re-sent w/ dispense as written instructions.

## 2016-07-19 NOTE — Addendum Note (Signed)
Addended by: Monte Fantasia on: 07/19/2016 09:51 AM   Modules accepted: Orders

## 2016-08-10 DIAGNOSIS — D225 Melanocytic nevi of trunk: Secondary | ICD-10-CM | POA: Diagnosis not present

## 2016-08-10 DIAGNOSIS — L739 Follicular disorder, unspecified: Secondary | ICD-10-CM | POA: Diagnosis not present

## 2016-08-10 DIAGNOSIS — L814 Other melanin hyperpigmentation: Secondary | ICD-10-CM | POA: Diagnosis not present

## 2016-08-10 DIAGNOSIS — L821 Other seborrheic keratosis: Secondary | ICD-10-CM | POA: Diagnosis not present

## 2016-10-19 ENCOUNTER — Ambulatory Visit (INDEPENDENT_AMBULATORY_CARE_PROVIDER_SITE_OTHER): Payer: BLUE CROSS/BLUE SHIELD | Admitting: Internal Medicine

## 2016-10-19 ENCOUNTER — Encounter: Payer: Self-pay | Admitting: Internal Medicine

## 2016-10-19 ENCOUNTER — Other Ambulatory Visit (INDEPENDENT_AMBULATORY_CARE_PROVIDER_SITE_OTHER): Payer: BLUE CROSS/BLUE SHIELD

## 2016-10-19 VITALS — BP 110/78 | HR 82 | Temp 97.9°F | Ht 72.0 in | Wt 180.0 lb

## 2016-10-19 DIAGNOSIS — R569 Unspecified convulsions: Secondary | ICD-10-CM

## 2016-10-19 DIAGNOSIS — Z0000 Encounter for general adult medical examination without abnormal findings: Secondary | ICD-10-CM | POA: Diagnosis not present

## 2016-10-19 DIAGNOSIS — G4725 Circadian rhythm sleep disorder, jet lag type: Secondary | ICD-10-CM | POA: Insufficient documentation

## 2016-10-19 LAB — LIPID PANEL
Cholesterol: 148 mg/dL (ref 0–200)
HDL: 45.7 mg/dL (ref >39.00)
LDL Cholesterol: 89 mg/dL (ref 0–99)
NONHDL: 102.64
Total CHOL/HDL Ratio: 3
Triglycerides: 70 mg/dL (ref 0.0–149.0)
VLDL: 14 mg/dL (ref 0.0–40.0)

## 2016-10-19 LAB — CBC WITH DIFFERENTIAL/PLATELET
BASOS ABS: 0 10*3/uL (ref 0.0–0.1)
Basophils Relative: 0.6 % (ref 0.0–3.0)
Eosinophils Absolute: 0.1 10*3/uL (ref 0.0–0.7)
Eosinophils Relative: 2.8 % (ref 0.0–5.0)
HEMATOCRIT: 44.9 % (ref 39.0–52.0)
Hemoglobin: 15.5 g/dL (ref 13.0–17.0)
LYMPHS ABS: 1.5 10*3/uL (ref 0.7–4.0)
Lymphocytes Relative: 41.1 % (ref 12.0–46.0)
MCHC: 34.4 g/dL (ref 30.0–36.0)
MCV: 93.9 fl (ref 78.0–100.0)
Monocytes Absolute: 0.3 10*3/uL (ref 0.1–1.0)
Monocytes Relative: 8.2 % (ref 3.0–12.0)
Neutro Abs: 1.7 10*3/uL (ref 1.4–7.7)
Neutrophils Relative %: 47.3 % (ref 43.0–77.0)
PLATELETS: 189 10*3/uL (ref 150.0–400.0)
RBC: 4.79 Mil/uL (ref 4.22–5.81)
RDW: 13.8 % (ref 11.5–15.5)
WBC: 3.6 10*3/uL — AB (ref 4.0–10.5)

## 2016-10-19 LAB — URINALYSIS
Bilirubin Urine: NEGATIVE
HGB URINE DIPSTICK: NEGATIVE
KETONES UR: NEGATIVE
Leukocytes, UA: NEGATIVE
Nitrite: NEGATIVE
Specific Gravity, Urine: 1.01 (ref 1.000–1.030)
Total Protein, Urine: NEGATIVE
Urine Glucose: NEGATIVE
Urobilinogen, UA: 0.2 (ref 0.0–1.0)
pH: 7 (ref 5.0–8.0)

## 2016-10-19 LAB — HEPATIC FUNCTION PANEL
ALBUMIN: 4.8 g/dL (ref 3.5–5.2)
ALK PHOS: 43 U/L (ref 39–117)
ALT: 15 U/L (ref 0–53)
AST: 15 U/L (ref 0–37)
Bilirubin, Direct: 0.1 mg/dL (ref 0.0–0.3)
TOTAL PROTEIN: 7 g/dL (ref 6.0–8.3)
Total Bilirubin: 0.5 mg/dL (ref 0.2–1.2)

## 2016-10-19 LAB — BASIC METABOLIC PANEL
BUN: 16 mg/dL (ref 6–23)
CALCIUM: 9.8 mg/dL (ref 8.4–10.5)
CO2: 33 mEq/L — ABNORMAL HIGH (ref 19–32)
CREATININE: 0.99 mg/dL (ref 0.40–1.50)
Chloride: 104 mEq/L (ref 96–112)
GFR: 88.65 mL/min (ref >60.00)
GLUCOSE: 95 mg/dL (ref 70–99)
POTASSIUM: 4.7 meq/L (ref 3.5–5.1)
Sodium: 141 mEq/L (ref 135–145)

## 2016-10-19 LAB — PSA: PSA: 1.01 ng/mL (ref 0.10–4.00)

## 2016-10-19 LAB — TSH: TSH: 1.23 u[IU]/mL (ref 0.35–4.50)

## 2016-10-19 MED ORDER — ZOLPIDEM TARTRATE 10 MG PO TABS: 10.0000 mg | ORAL_TABLET | Freq: Every evening | ORAL | 0 refills | Status: DC | PRN

## 2016-10-19 NOTE — Assessment & Plan Note (Signed)
Dilantin Rx, labs - per neurology

## 2016-10-19 NOTE — Assessment & Plan Note (Signed)
Zolpidem, Melatonin

## 2016-10-19 NOTE — Assessment & Plan Note (Addendum)
We discussed age appropriate health related issues, including available/recomended screening tests and vaccinations. We discussed a need for adhering to healthy diet and exercise. Labs ordered. All questions were answered. 

## 2016-10-19 NOTE — Progress Notes (Signed)
Subjective:  Patient ID: Jay Hall, male    DOB: 1976/01/21  Age: 41 y.o. MRN: [id number]  CC: Annual Exam (CPE)   HPI AMADO ANDAL presents for well exam C/o occ HAs c/o a jet lag sometimes  Outpatient Medications Prior to Visit  Medication Sig Dispense Refill  . DILANTIN 100 MG ER capsule Take 3 capsules (300 mg total) by mouth daily. 270 capsule 3  . DILANTIN INFATABS 50 MG tablet CHEW 1 TABLET (50 MG TOTAL) BY MOUTH DAILY. 90 tablet 3  . JUBLIA 10 % SOLN APPLY TO TOENAIL EVERY DAY AFTER BATHING  1  . cholecalciferol (VITAMIN D) 1000 UNITS tablet Take 1,000 Units by mouth as needed.      No facility-administered medications prior to visit.     ROS Review of Systems  Constitutional: Negative for appetite change, fatigue and unexpected weight change.  HENT: Negative for congestion, nosebleeds, sneezing, sore throat and trouble swallowing.   Eyes: Negative for itching and visual disturbance.  Respiratory: Negative for cough.   Cardiovascular: Negative for chest pain, palpitations and leg swelling.  Gastrointestinal: Negative for abdominal distention, blood in stool, diarrhea and nausea.  Genitourinary: Negative for frequency and hematuria.  Musculoskeletal: Negative for back pain, gait problem, joint swelling and neck pain.  Skin: Negative for rash.  Neurological: Negative for dizziness, tremors, speech difficulty and weakness.  Psychiatric/Behavioral: Negative for agitation, dysphoric mood and sleep disturbance. The patient is not nervous/anxious.     Objective:  BP 110/78   Pulse 82   Temp 97.9 F (36.6 C)   Ht 6' (1.829 m)   Wt 180 lb (81.6 kg)   SpO2 99%   BMI 24.41 kg/m   BP Readings from Last 3 Encounters:  10/19/16 110/78  04/25/16 132/84  05/27/15 118/80    Wt Readings from Last 3 Encounters:  10/19/16 180 lb (81.6 kg)  04/25/16 179 lb 8 oz (81.4 kg)  05/27/15 180 lb (81.6 kg)    Physical Exam  Constitutional: He is oriented to  person, place, and time. He appears well-developed. No distress.  NAD  HENT:  Mouth/Throat: Oropharynx is clear and moist.  Eyes: Conjunctivae are normal. Pupils are equal, round, and reactive to light.  Neck: Normal range of motion. No JVD present. No thyromegaly present.  Cardiovascular: Normal rate, regular rhythm, normal heart sounds and intact distal pulses.  Exam reveals no gallop and no friction rub.   No murmur heard. Pulmonary/Chest: Effort normal and breath sounds normal. No respiratory distress. He has no wheezes. He has no rales. He exhibits no tenderness.  Abdominal: Soft. Bowel sounds are normal. He exhibits no distension and no mass. There is no tenderness. There is no rebound and no guarding.  Genitourinary: Penis normal. No penile tenderness.  Musculoskeletal: Normal range of motion. He exhibits no edema or tenderness.  Lymphadenopathy:    He has no cervical adenopathy.  Neurological: He is alert and oriented to person, place, and time. He has normal reflexes. No cranial nerve deficit. He exhibits normal muscle tone. He displays a negative Romberg sign. Coordination and gait normal.  Skin: Skin is warm and dry. No rash noted.  Psychiatric: He has a normal mood and affect. His behavior is normal. Judgment and thought content normal.  testes nl  Lab Results  Component Value Date   WBC 3.1 (L) 04/25/2016   HGB 15.7 04/23/2014   HCT 43.7 04/25/2016   PLT 174 04/25/2016   GLUCOSE 81 04/25/2016   CHOL  140 06/16/2015   TRIG 78.0 06/16/2015   HDL 45.40 06/16/2015   LDLCALC 79 06/16/2015   ALT 18 04/25/2016   AST 20 04/25/2016   NA 144 04/25/2016   K 4.8 04/25/2016   CL 104 04/25/2016   CREATININE 0.91 04/25/2016   BUN 18 04/25/2016   CO2 28 04/25/2016   TSH 1.13 06/16/2015   MICROALBUR 0.8 12/06/2010    No results found.  Assessment & Plan:   There are no diagnoses linked to this encounter. I am having Mr. Beever maintain his cholecalciferol, JUBLIA,  DILANTIN, and DILANTIN INFATABS.  No orders of the defined types were placed in this encounter.    Follow-up: No Follow-up on file.  Walker Kehr, MD

## 2016-10-19 NOTE — Patient Instructions (Signed)
Melatonin for jet lag

## 2016-10-19 NOTE — Progress Notes (Signed)
Pre visit review using our clinic review tool, if applicable. No additional management support is needed unless otherwise documented below in the visit note. 

## 2017-04-24 ENCOUNTER — Encounter: Payer: Self-pay | Admitting: Adult Health

## 2017-04-24 ENCOUNTER — Other Ambulatory Visit: Payer: Self-pay | Admitting: Adult Health

## 2017-04-24 ENCOUNTER — Ambulatory Visit (INDEPENDENT_AMBULATORY_CARE_PROVIDER_SITE_OTHER): Payer: BLUE CROSS/BLUE SHIELD | Admitting: Adult Health

## 2017-04-24 VITALS — BP 118/85 | HR 92 | Wt 180.8 lb

## 2017-04-24 DIAGNOSIS — Z79899 Other long term (current) drug therapy: Secondary | ICD-10-CM

## 2017-04-24 DIAGNOSIS — R569 Unspecified convulsions: Secondary | ICD-10-CM

## 2017-04-24 DIAGNOSIS — Z5181 Encounter for therapeutic drug level monitoring: Secondary | ICD-10-CM

## 2017-04-24 MED ORDER — DILANTIN INFATABS 50 MG PO CHEW: CHEWABLE_TABLET | ORAL | 3 refills | Status: DC

## 2017-04-24 MED ORDER — DILANTIN 100 MG PO CAPS: 300.0000 mg | ORAL_CAPSULE | Freq: Every day | ORAL | 3 refills | Status: DC

## 2017-04-24 NOTE — Progress Notes (Signed)
I have read the note, and I agree with the clinical assessment and plan.  Allona Gondek KEITH   

## 2017-04-24 NOTE — Progress Notes (Signed)
PATIENT: Jay Hall DOB: Sep 28, 1975  REASON FOR VISIT: follow up-seizures HISTORY FROM: patient  HISTORY OF PRESENT ILLNESS: Today 04/24/17 Mr. Giller is a 41 year old male with a history of seizures. He returns today for follow-up. He remains on Dilantin 350 mg daily. He reports he isn't had any seizure events. Reports that his last seizure was in 1998. He is able to complete all ADLs independently. He operates a Teacher, music without difficulty. Denies any changes in his gait or balance. He returns today for an evaluation.  HISTORY 04/25/16: Mr. Perrell is a 41 year old right-handed white male with a history of seizures that have been well controlled. The patient has not had a seizure since 1998. He has been on Dilantin taking 350 mg a day, he denies any side effects on the medication. He is on vitamin D supplementation. He denies any new medical issues that have come up since last seen. The patient is operating a motor vehicle without difficulty. He returns for an evaluation  REVIEW OF SYSTEMS: Out of a complete 14 system review of symptoms, the patient complains only of the following symptoms, and all other reviewed systems are negative.  ALLERGIES: No Known Allergies  HOME MEDICATIONS: Outpatient Medications Prior to Visit  Medication Sig Dispense Refill  . cholecalciferol (VITAMIN D) 1000 UNITS tablet Take 1,000 Units by mouth as needed.     Marland Kitchen DILANTIN 100 MG ER capsule Take 3 capsules (300 mg total) by mouth daily. 270 capsule 3  . DILANTIN INFATABS 50 MG tablet CHEW 1 TABLET (50 MG TOTAL) BY MOUTH DAILY. 90 tablet 3  . JUBLIA 10 % SOLN APPLY TO TOENAIL EVERY DAY AFTER BATHING  1  . zolpidem (AMBIEN) 10 MG tablet Take 1 tablet (10 mg total) by mouth at bedtime as needed for sleep. 30 tablet 0   No facility-administered medications prior to visit.     PAST MEDICAL HISTORY: Past Medical History:  Diagnosis Date  . Epilepsy [facility]) 1998   Dr. Cloretta Ned     PAST SURGICAL HISTORY: Past Surgical History:  Procedure Laterality Date  . NO PAST SURGERIES      FAMILY HISTORY: Family History  Problem Relation Age of Onset  . Hypertension Mother   . Cancer Father        Prostate  . Hypertension Father   . Seizures Neg Hx     SOCIAL HISTORY: Social History   Social History  . Marital status: Married    Spouse name: [name]  . Number of children: 2  . Years of education: BA   Occupational History  . 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 Smoking status: Never Smoker  . Smokeless tobacco: Never Used  . Alcohol use Yes  . Drug use: No  . Sexual activity: Yes   Other Topics Concern  . Not on file   Social History Narrative   Regular exercise-no   Patient lives at home with with [name].    Patient has 2 children.    Patient has a BA degree.       PHYSICAL EXAM  Vitals:   04/24/17 0811  BP: 118/85  Pulse: 92  Weight: 180 lb 12.8 oz (82 kg)   Body mass index is 24.52 kg/m.  Generalized: Well developed, in no acute distress   Neurological examination  Mentation: Alert oriented to time, place, history taking. Follows all commands speech and language fluent Cranial nerve II-XII: Pupils were equal  round reactive to light. Extraocular movements were full, visual field were full on confrontational test. Facial sensation and strength were normal. Uvula tongue midline. Head turning and shoulder shrug  were normal and symmetric. Motor: The motor testing reveals 5 over 5 strength of all 4 extremities. Good symmetric motor tone is noted throughout.  Sensory: Sensory testing is intact to soft touch on all 4 extremities. No evidence of extinction is noted.  Coordination: Cerebellar testing reveals good finger-nose-finger and heel-to-shin bilaterally.  Gait and station: Gait is normal. Tandem gait is normal. Romberg is negative. No drift is seen.  Reflexes: Deep tendon reflexes are  symmetric and normal bilaterally.   DIAGNOSTIC DATA (LABS, IMAGING, TESTING) - I reviewed patient records, labs, notes, testing and imaging myself where available.  Lab Results  Component Value Date   WBC 3.6 (L) 10/19/2016   HGB 15.5 10/19/2016   HCT 44.9 10/19/2016   MCV 93.9 10/19/2016   PLT 189.0 10/19/2016      Component Value Date/Time   NA 141 10/19/2016 0909   NA 144 04/25/2016 0921   K 4.7 10/19/2016 0909   CL 104 10/19/2016 0909   CO2 33 (H) 10/19/2016 0909   GLUCOSE 95 10/19/2016 0909   BUN 16 10/19/2016 0909   BUN 18 04/25/2016 0921   CREATININE 0.99 10/19/2016 0909   CALCIUM 9.8 10/19/2016 0909   PROT 7.0 10/19/2016 0909   PROT 6.8 04/25/2016 0921   ALBUMIN 4.8 10/19/2016 0909   ALBUMIN 4.6 04/25/2016 0921   AST 15 10/19/2016 0909   ALT 15 10/19/2016 0909   ALKPHOS 43 10/19/2016 0909   BILITOT 0.5 10/19/2016 0909   BILITOT <0.2 04/25/2016 0921   GFRNONAA 105 04/25/2016 0921   GFRAA 121 04/25/2016 0921   Lab Results  Component Value Date   CHOL 148 10/19/2016   HDL 45.70 10/19/2016   LDLCALC 89 10/19/2016   TRIG 70.0 10/19/2016   CHOLHDL 3 10/19/2016   No results found for: HGBA1C Lab Results  Component Value Date   VITAMINB12 241 02/15/2010   Lab Results  Component Value Date   TSH 1.23 10/19/2016      ASSESSMENT AND PLAN 41 y.o. year old male  has a past medical history of Epilepsy ([place]) (1998). here with:  1. Seizures  Overall the patient is doing well. He will continue on Dilantin 350 mg daily. I will check blood work today. His last DEXA scan was in 2012. The patient is interested in repeating this. An order was placed. He is advised that if his symptoms worsen or he develops new symptoms he should let us know. He will follow-up in 1 year or sooner if needed.  I spent 15 minutes with the patient. 50% of this time was spent discussing medication Dilantin   Ward Givens, MSN, NP-C 04/24/2017, 8:07 AM [facility] [address], [place], [place] [address] [phone]

## 2017-04-24 NOTE — Patient Instructions (Signed)
Your Plan:  Continue Dilantin 350 mg daily Blood work today If you have any seizure events please let us know If your symptoms worsen or you develop new symptoms please let us know.   Thank you for coming to see [place] at [facility]. I hope we have been able to provide you high quality care today.  You may receive a patient satisfaction survey over the next few weeks. We would appreciate your feedback and comments so that we may continue to improve ourselves and the health of our patients.

## 2017-04-25 ENCOUNTER — Telehealth: Payer: Self-pay | Admitting: *Deleted

## 2017-04-25 LAB — CBC WITH DIFFERENTIAL/PLATELET
BASOS: 1 %
Basophils Absolute: 0 10*3/uL (ref 0.0–0.2)
EOS (ABSOLUTE): 0.1 10*3/uL (ref 0.0–0.4)
EOS: 3 %
HEMATOCRIT: 42.4 % (ref 37.5–51.0)
HEMOGLOBIN: 14.8 g/dL (ref 13.0–17.7)
IMMATURE GRANS (ABS): 0 10*3/uL (ref 0.0–0.1)
Immature Granulocytes: 0 %
LYMPHS ABS: 1.3 10*3/uL (ref 0.7–3.1)
LYMPHS: 42 %
MCH: 31.8 pg (ref 26.6–33.0)
MCHC: 34.9 g/dL (ref 31.5–35.7)
MCV: 91 fL (ref 79–97)
MONOCYTES: 8 %
Monocytes Absolute: 0.3 10*3/uL (ref 0.1–0.9)
NEUTROS ABS: 1.5 10*3/uL (ref 1.4–7.0)
Neutrophils: 46 %
Platelets: 194 10*3/uL (ref 150–379)
RBC: 4.65 x10E6/uL (ref 4.14–5.80)
RDW: 13.9 % (ref 12.3–15.4)
WBC: 3.2 10*3/uL — ABNORMAL LOW (ref 3.4–10.8)

## 2017-04-25 LAB — COMPREHENSIVE METABOLIC PANEL
ALT: 21 IU/L (ref 0–44)
AST: 23 IU/L (ref 0–40)
Albumin/Globulin Ratio: 2 (ref 1.2–2.2)
Albumin: 4.5 g/dL (ref 3.5–5.5)
Alkaline Phosphatase: 48 IU/L (ref 39–117)
BUN/Creatinine Ratio: 14 (ref 9–20)
BUN: 15 mg/dL (ref 6–24)
Bilirubin Total: 0.3 mg/dL (ref 0.0–1.2)
CALCIUM: 9.5 mg/dL (ref 8.7–10.2)
CO2: 26 mmol/L (ref 20–29)
CREATININE: 1.09 mg/dL (ref 0.76–1.27)
Chloride: 100 mmol/L (ref 96–106)
GFR, EST AFRICAN AMERICAN: 97 mL/min/{1.73_m2} (ref >59)
GFR, EST NON AFRICAN AMERICAN: 84 mL/min/{1.73_m2} (ref >59)
GLUCOSE: 94 mg/dL (ref 65–99)
Globulin, Total: 2.2 g/dL (ref 1.5–4.5)
POTASSIUM: 4.5 mmol/L (ref 3.5–5.2)
Sodium: 141 mmol/L (ref 134–144)
TOTAL PROTEIN: 6.7 g/dL (ref 6.0–8.5)

## 2017-04-25 LAB — PHENYTOIN LEVEL, TOTAL: PHENYTOIN (DILANTIN), SERUM: 11.3 ug/mL (ref 10.0–20.0)

## 2017-04-25 NOTE — Telephone Encounter (Signed)
LVM informing patient his labs are unremarkable, no medication changes at this time. Left number for any questions.

## 2017-05-09 ENCOUNTER — Telehealth: Payer: Self-pay | Admitting: *Deleted

## 2017-05-09 ENCOUNTER — Ambulatory Visit
Admission: RE | Admit: 2017-05-09 | Discharge: 2017-05-09 | Disposition: A | Discharge status: Home / Self Care | Payer: BLUE CROSS/BLUE SHIELD | Source: Ambulatory Visit | Attending: Adult Health | Admitting: Adult Health

## 2017-05-09 DIAGNOSIS — Z79899 Other long term (current) drug therapy: Secondary | ICD-10-CM

## 2017-05-09 DIAGNOSIS — Z5181 Encounter for therapeutic drug level monitoring: Secondary | ICD-10-CM

## 2017-05-09 DIAGNOSIS — M85851 Other specified disorders of bone density and structure, right thigh: Secondary | ICD-10-CM | POA: Diagnosis not present

## 2017-05-09 DIAGNOSIS — R569 Unspecified convulsions: Secondary | ICD-10-CM

## 2017-05-09 NOTE — Telephone Encounter (Signed)
LVM informing patient that his bone density results are normal for his age. Left number for any questions.

## 2017-05-15 DIAGNOSIS — M9903 Segmental and somatic dysfunction of lumbar region: Secondary | ICD-10-CM | POA: Diagnosis not present

## 2017-05-15 DIAGNOSIS — M9901 Segmental and somatic dysfunction of cervical region: Secondary | ICD-10-CM | POA: Diagnosis not present

## 2017-05-15 DIAGNOSIS — M9902 Segmental and somatic dysfunction of thoracic region: Secondary | ICD-10-CM | POA: Diagnosis not present

## 2017-05-15 DIAGNOSIS — M531 Cervicobrachial syndrome: Secondary | ICD-10-CM | POA: Diagnosis not present

## 2017-05-26 DIAGNOSIS — M531 Cervicobrachial syndrome: Secondary | ICD-10-CM | POA: Diagnosis not present

## 2017-05-26 DIAGNOSIS — M9903 Segmental and somatic dysfunction of lumbar region: Secondary | ICD-10-CM | POA: Diagnosis not present

## 2017-05-26 DIAGNOSIS — M9901 Segmental and somatic dysfunction of cervical region: Secondary | ICD-10-CM | POA: Diagnosis not present

## 2017-05-26 DIAGNOSIS — M9902 Segmental and somatic dysfunction of thoracic region: Secondary | ICD-10-CM | POA: Diagnosis not present

## 2017-06-01 DIAGNOSIS — M9902 Segmental and somatic dysfunction of thoracic region: Secondary | ICD-10-CM | POA: Diagnosis not present

## 2017-06-01 DIAGNOSIS — M531 Cervicobrachial syndrome: Secondary | ICD-10-CM | POA: Diagnosis not present

## 2017-06-01 DIAGNOSIS — M9901 Segmental and somatic dysfunction of cervical region: Secondary | ICD-10-CM | POA: Diagnosis not present

## 2017-06-01 DIAGNOSIS — M9903 Segmental and somatic dysfunction of lumbar region: Secondary | ICD-10-CM | POA: Diagnosis not present

## 2017-06-06 DIAGNOSIS — M9901 Segmental and somatic dysfunction of cervical region: Secondary | ICD-10-CM | POA: Diagnosis not present

## 2017-06-06 DIAGNOSIS — M9902 Segmental and somatic dysfunction of thoracic region: Secondary | ICD-10-CM | POA: Diagnosis not present

## 2017-06-06 DIAGNOSIS — M531 Cervicobrachial syndrome: Secondary | ICD-10-CM | POA: Diagnosis not present

## 2017-06-06 DIAGNOSIS — M9903 Segmental and somatic dysfunction of lumbar region: Secondary | ICD-10-CM | POA: Diagnosis not present

## 2017-06-08 DIAGNOSIS — M9903 Segmental and somatic dysfunction of lumbar region: Secondary | ICD-10-CM | POA: Diagnosis not present

## 2017-06-08 DIAGNOSIS — M9901 Segmental and somatic dysfunction of cervical region: Secondary | ICD-10-CM | POA: Diagnosis not present

## 2017-06-08 DIAGNOSIS — M9902 Segmental and somatic dysfunction of thoracic region: Secondary | ICD-10-CM | POA: Diagnosis not present

## 2017-06-08 DIAGNOSIS — M531 Cervicobrachial syndrome: Secondary | ICD-10-CM | POA: Diagnosis not present

## 2017-06-12 DIAGNOSIS — M531 Cervicobrachial syndrome: Secondary | ICD-10-CM | POA: Diagnosis not present

## 2017-06-12 DIAGNOSIS — M9903 Segmental and somatic dysfunction of lumbar region: Secondary | ICD-10-CM | POA: Diagnosis not present

## 2017-06-12 DIAGNOSIS — M9901 Segmental and somatic dysfunction of cervical region: Secondary | ICD-10-CM | POA: Diagnosis not present

## 2017-06-12 DIAGNOSIS — M9902 Segmental and somatic dysfunction of thoracic region: Secondary | ICD-10-CM | POA: Diagnosis not present

## 2017-06-16 DIAGNOSIS — M9902 Segmental and somatic dysfunction of thoracic region: Secondary | ICD-10-CM | POA: Diagnosis not present

## 2017-06-16 DIAGNOSIS — M531 Cervicobrachial syndrome: Secondary | ICD-10-CM | POA: Diagnosis not present

## 2017-06-16 DIAGNOSIS — M9903 Segmental and somatic dysfunction of lumbar region: Secondary | ICD-10-CM | POA: Diagnosis not present

## 2017-06-16 DIAGNOSIS — M9901 Segmental and somatic dysfunction of cervical region: Secondary | ICD-10-CM | POA: Diagnosis not present

## 2017-06-20 DIAGNOSIS — M9901 Segmental and somatic dysfunction of cervical region: Secondary | ICD-10-CM | POA: Diagnosis not present

## 2017-06-20 DIAGNOSIS — M9902 Segmental and somatic dysfunction of thoracic region: Secondary | ICD-10-CM | POA: Diagnosis not present

## 2017-06-20 DIAGNOSIS — M531 Cervicobrachial syndrome: Secondary | ICD-10-CM | POA: Diagnosis not present

## 2017-06-20 DIAGNOSIS — M9903 Segmental and somatic dysfunction of lumbar region: Secondary | ICD-10-CM | POA: Diagnosis not present

## 2017-06-27 DIAGNOSIS — M9903 Segmental and somatic dysfunction of lumbar region: Secondary | ICD-10-CM | POA: Diagnosis not present

## 2017-06-27 DIAGNOSIS — M9901 Segmental and somatic dysfunction of cervical region: Secondary | ICD-10-CM | POA: Diagnosis not present

## 2017-06-27 DIAGNOSIS — M531 Cervicobrachial syndrome: Secondary | ICD-10-CM | POA: Diagnosis not present

## 2017-06-27 DIAGNOSIS — M9902 Segmental and somatic dysfunction of thoracic region: Secondary | ICD-10-CM | POA: Diagnosis not present

## 2017-07-04 DIAGNOSIS — M9901 Segmental and somatic dysfunction of cervical region: Secondary | ICD-10-CM | POA: Diagnosis not present

## 2017-07-04 DIAGNOSIS — M531 Cervicobrachial syndrome: Secondary | ICD-10-CM | POA: Diagnosis not present

## 2017-07-04 DIAGNOSIS — M9902 Segmental and somatic dysfunction of thoracic region: Secondary | ICD-10-CM | POA: Diagnosis not present

## 2017-07-04 DIAGNOSIS — M9903 Segmental and somatic dysfunction of lumbar region: Secondary | ICD-10-CM | POA: Diagnosis not present

## 2017-07-21 DIAGNOSIS — M531 Cervicobrachial syndrome: Secondary | ICD-10-CM | POA: Diagnosis not present

## 2017-07-21 DIAGNOSIS — M9902 Segmental and somatic dysfunction of thoracic region: Secondary | ICD-10-CM | POA: Diagnosis not present

## 2017-07-21 DIAGNOSIS — M9903 Segmental and somatic dysfunction of lumbar region: Secondary | ICD-10-CM | POA: Diagnosis not present

## 2017-07-21 DIAGNOSIS — M9901 Segmental and somatic dysfunction of cervical region: Secondary | ICD-10-CM | POA: Diagnosis not present

## 2017-07-23 DIAGNOSIS — B078 Other viral warts: Secondary | ICD-10-CM | POA: Diagnosis not present

## 2017-07-23 DIAGNOSIS — D225 Melanocytic nevi of trunk: Secondary | ICD-10-CM | POA: Diagnosis not present

## 2017-07-23 DIAGNOSIS — L814 Other melanin hyperpigmentation: Secondary | ICD-10-CM | POA: Diagnosis not present

## 2017-07-23 DIAGNOSIS — L309 Dermatitis, unspecified: Secondary | ICD-10-CM | POA: Diagnosis not present

## 2017-08-01 DIAGNOSIS — M531 Cervicobrachial syndrome: Secondary | ICD-10-CM | POA: Diagnosis not present

## 2017-08-01 DIAGNOSIS — M9902 Segmental and somatic dysfunction of thoracic region: Secondary | ICD-10-CM | POA: Diagnosis not present

## 2017-08-01 DIAGNOSIS — M9901 Segmental and somatic dysfunction of cervical region: Secondary | ICD-10-CM | POA: Diagnosis not present

## 2017-08-01 DIAGNOSIS — M9903 Segmental and somatic dysfunction of lumbar region: Secondary | ICD-10-CM | POA: Diagnosis not present

## 2017-08-29 DIAGNOSIS — M9901 Segmental and somatic dysfunction of cervical region: Secondary | ICD-10-CM | POA: Diagnosis not present

## 2017-08-29 DIAGNOSIS — M9903 Segmental and somatic dysfunction of lumbar region: Secondary | ICD-10-CM | POA: Diagnosis not present

## 2017-08-29 DIAGNOSIS — M9902 Segmental and somatic dysfunction of thoracic region: Secondary | ICD-10-CM | POA: Diagnosis not present

## 2017-08-29 DIAGNOSIS — M531 Cervicobrachial syndrome: Secondary | ICD-10-CM | POA: Diagnosis not present

## 2017-09-26 DIAGNOSIS — M9902 Segmental and somatic dysfunction of thoracic region: Secondary | ICD-10-CM | POA: Diagnosis not present

## 2017-09-26 DIAGNOSIS — M531 Cervicobrachial syndrome: Secondary | ICD-10-CM | POA: Diagnosis not present

## 2017-09-26 DIAGNOSIS — M9901 Segmental and somatic dysfunction of cervical region: Secondary | ICD-10-CM | POA: Diagnosis not present

## 2017-09-26 DIAGNOSIS — M9903 Segmental and somatic dysfunction of lumbar region: Secondary | ICD-10-CM | POA: Diagnosis not present

## 2017-10-09 DIAGNOSIS — L309 Dermatitis, unspecified: Secondary | ICD-10-CM | POA: Diagnosis not present

## 2017-10-25 DIAGNOSIS — M9902 Segmental and somatic dysfunction of thoracic region: Secondary | ICD-10-CM | POA: Diagnosis not present

## 2017-10-25 DIAGNOSIS — M531 Cervicobrachial syndrome: Secondary | ICD-10-CM | POA: Diagnosis not present

## 2017-10-25 DIAGNOSIS — M9901 Segmental and somatic dysfunction of cervical region: Secondary | ICD-10-CM | POA: Diagnosis not present

## 2017-10-25 DIAGNOSIS — M9903 Segmental and somatic dysfunction of lumbar region: Secondary | ICD-10-CM | POA: Diagnosis not present

## 2017-12-03 DIAGNOSIS — M9903 Segmental and somatic dysfunction of lumbar region: Secondary | ICD-10-CM | POA: Diagnosis not present

## 2017-12-03 DIAGNOSIS — M531 Cervicobrachial syndrome: Secondary | ICD-10-CM | POA: Diagnosis not present

## 2017-12-03 DIAGNOSIS — M9901 Segmental and somatic dysfunction of cervical region: Secondary | ICD-10-CM | POA: Diagnosis not present

## 2017-12-03 DIAGNOSIS — M9902 Segmental and somatic dysfunction of thoracic region: Secondary | ICD-10-CM | POA: Diagnosis not present

## 2017-12-19 DIAGNOSIS — H6012 Cellulitis of left external ear: Secondary | ICD-10-CM | POA: Diagnosis not present

## 2018-01-08 ENCOUNTER — Encounter: Payer: Self-pay | Admitting: Family

## 2018-01-08 ENCOUNTER — Ambulatory Visit: Payer: BLUE CROSS/BLUE SHIELD | Admitting: Family

## 2018-01-08 VITALS — BP 122/84 | HR 98 | Temp 98.1°F | Ht 72.0 in | Wt 183.1 lb

## 2018-01-08 DIAGNOSIS — J209 Acute bronchitis, unspecified: Secondary | ICD-10-CM

## 2018-01-08 MED ORDER — ALBUTEROL SULFATE 108 (90 BASE) MCG/ACT IN AEPB: 1.0000 | INHALATION_SPRAY | Freq: Four times a day (QID) | RESPIRATORY_TRACT | 0 refills | Status: DC

## 2018-01-08 MED ORDER — BENZONATATE 100 MG PO CAPS: 100.0000 mg | ORAL_CAPSULE | Freq: Three times a day (TID) | ORAL | 0 refills | Status: DC | PRN

## 2018-01-08 MED ORDER — AZITHROMYCIN 250 MG PO TABS: ORAL_TABLET | ORAL | 0 refills | Status: DC

## 2018-01-08 NOTE — Progress Notes (Signed)
Jay Hall is a 42 y.o. male with the following history as recorded in Epic[employer]:  Patient Active Problem List   Diagnosis Date Noted  . 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 Tachycardia 05/27/2015  . Loss of weight 10/30/2013  . Neoplasm of uncertain behavior of skin 10/30/2013  . Localization-related (focal) (partial) epilepsy and epileptic syndromes with complex partial seizures, without mention of intractable epilepsy 04/23/2013  . Encounter for long-term (current) use of other medications 04/23/2013  . Well adult exam 07/30/2012  . Abnormal CBC 01/12/2011  . Ingrowing toenail 12/07/2010  . WART, VIRAL 02/15/2010  . ONYCHOMYCOSIS 02/15/2010  . TINEA PEDIS 02/15/2010  . Convulsions ([place]) 02/15/2010    Current Outpatient Medications  Medication Sig Dispense Refill  . cholecalciferol (VITAMIN D) 1000 UNITS tablet Take 1,000 Units by mouth as needed.     Marland Kitchen DILANTIN 100 MG ER capsule Take 3 capsules (300 mg total) by mouth daily. 270 capsule 3  . DILANTIN INFATABS 50 MG tablet CHEW 1 TABLET (50 MG TOTAL) BY MOUTH DAILY. 90 tablet 3  . Fluocinonide 0.1 % CREA APPLY TO AFFECTED AREA TWICE A DAY UNTIL HEALED  2  . Albuterol Sulfate (PROAIR RESPICLICK) 768 (90 Base) MCG/ACT AEPB Inhale 1 puff into the lungs 4 (four) times daily. 2 puffs every 4-6 hours prn for cough/ wheezing 1 each 0  . azithromycin (ZITHROMAX) 250 MG tablet 2 tabs po qd x 1 day; 1 tablet per day x 4 days; 6 tablet 0  . benzonatate (TESSALON) 100 MG capsule Take 1 capsule (100 mg total) by mouth 3 (three) times daily as needed. 20 capsule 0   No current facility-administered medications for this visit.     Allergies: Patient has no known allergies.  Past Medical History:  Diagnosis Date  . Epilepsy [facility]) 1998   Dr. Cloretta Ned    Past Surgical History:  Procedure Laterality Date  . NO PAST SURGERIES      Family History  Problem Relation Age of Onset  . Hypertension Mother   . Cancer Father        Prostate  .  Hypertension Father   . Seizures Neg Hx     Social History   Tobacco Use  . Smoking status: Never Smoker  . Smokeless tobacco: Never Used  Substance Use Topics  . Alcohol use: Yes    Subjective:  Started last Wednesday with bad headache and "just didn't feel well." Progressed into chest congestion/ "bad cough;" Low grade fever when the symptoms first started; + barking cough; not prone to bronchitis, asthma or allergies; son and wife have been sick with similar symptoms;   Objective:  Vitals:   01/08/18 0824  BP: 122/84  Pulse: 98  Temp: 98.1 F (36.7 C)  TempSrc: Oral  SpO2: 97%  Weight: 183 lb 1.3 oz (83 kg)  Height: 6' (1.829 m)    General: Well developed, well nourished, in no acute distress  Skin : Warm and dry.  Head: Normocephalic and atraumatic  Eyes: Sclera and conjunctiva clear; pupils round and reactive to light; extraocular movements intact  Ears: External normal; canals clear; tympanic membranes normal  Oropharynx: Pink, supple. No suspicious lesions  Neck: Supple without thyromegaly, adenopathy  Lungs: Respirations unlabored; clear to auscultation bilaterally without wheeze, rales, rhonchi  CVS exam: normal rate and regular rhythm.  Neurologic: Alert and oriented; speech intact; face symmetrical; moves all extremities well; CNII-XII intact without focal deficit  Assessment:  1. Acute bronchitis, unspecified organism  Plan:  Suspect secondary to flu-like illness; suspect headaches are part of the disease process; Rx for Zpak, albuterol and Tessalon perles; increase fluids, rest and follow up worse no better; He will follow-up if his headaches return after illness is treated;   No follow-ups on file.  No orders of the defined types were placed in this encounter.   Requested Prescriptions   Signed Prescriptions Disp Refills  . azithromycin (ZITHROMAX) 250 MG tablet 6 tablet 0    Sig: 2 tabs po qd x 1 day; 1 tablet per day x 4 days;  . Albuterol Sulfate  (PROAIR RESPICLICK) 546 (90 Base) MCG/ACT AEPB 1 each 0    Sig: Inhale 1 puff into the lungs 4 (four) times daily. 2 puffs every 4-6 hours prn for cough/ wheezing  . benzonatate (TESSALON) 100 MG capsule 20 capsule 0    Sig: Take 1 capsule (100 mg total) by mouth 3 (three) times daily as needed.

## 2018-01-23 DIAGNOSIS — M9903 Segmental and somatic dysfunction of lumbar region: Secondary | ICD-10-CM | POA: Diagnosis not present

## 2018-01-23 DIAGNOSIS — M9901 Segmental and somatic dysfunction of cervical region: Secondary | ICD-10-CM | POA: Diagnosis not present

## 2018-01-23 DIAGNOSIS — M531 Cervicobrachial syndrome: Secondary | ICD-10-CM | POA: Diagnosis not present

## 2018-01-23 DIAGNOSIS — M9902 Segmental and somatic dysfunction of thoracic region: Secondary | ICD-10-CM | POA: Diagnosis not present

## 2018-03-19 DIAGNOSIS — L820 Inflamed seborrheic keratosis: Secondary | ICD-10-CM | POA: Diagnosis not present

## 2018-03-19 DIAGNOSIS — B351 Tinea unguium: Secondary | ICD-10-CM | POA: Diagnosis not present

## 2018-03-19 DIAGNOSIS — L2089 Other atopic dermatitis: Secondary | ICD-10-CM | POA: Diagnosis not present

## 2018-03-19 DIAGNOSIS — B078 Other viral warts: Secondary | ICD-10-CM | POA: Diagnosis not present

## 2018-03-19 DIAGNOSIS — L249 Irritant contact dermatitis, unspecified cause: Secondary | ICD-10-CM | POA: Diagnosis not present

## 2018-04-23 DIAGNOSIS — M531 Cervicobrachial syndrome: Secondary | ICD-10-CM | POA: Diagnosis not present

## 2018-04-23 DIAGNOSIS — M9902 Segmental and somatic dysfunction of thoracic region: Secondary | ICD-10-CM | POA: Diagnosis not present

## 2018-04-23 DIAGNOSIS — M9903 Segmental and somatic dysfunction of lumbar region: Secondary | ICD-10-CM | POA: Diagnosis not present

## 2018-04-23 DIAGNOSIS — M9901 Segmental and somatic dysfunction of cervical region: Secondary | ICD-10-CM | POA: Diagnosis not present

## 2018-04-24 ENCOUNTER — Ambulatory Visit: Payer: BLUE CROSS/BLUE SHIELD | Admitting: Adult Health

## 2018-04-29 ENCOUNTER — Encounter: Payer: Self-pay | Admitting: Adult Health

## 2018-04-29 ENCOUNTER — Ambulatory Visit: Payer: BLUE CROSS/BLUE SHIELD | Admitting: Adult Health

## 2018-04-29 VITALS — BP 119/78 | HR 82 | Ht 72.0 in | Wt 184.4 lb

## 2018-04-29 DIAGNOSIS — Z5181 Encounter for therapeutic drug level monitoring: Secondary | ICD-10-CM | POA: Diagnosis not present

## 2018-04-29 DIAGNOSIS — R569 Unspecified convulsions: Secondary | ICD-10-CM | POA: Diagnosis not present

## 2018-04-29 DIAGNOSIS — Z79899 Other long term (current) drug therapy: Secondary | ICD-10-CM

## 2018-04-29 MED ORDER — DILANTIN INFATABS 50 MG PO CHEW: CHEWABLE_TABLET | ORAL | 3 refills | Status: DC

## 2018-04-29 MED ORDER — DILANTIN 100 MG PO CAPS: 300.0000 mg | ORAL_CAPSULE | Freq: Every day | ORAL | 3 refills | Status: DC

## 2018-04-29 NOTE — Progress Notes (Signed)
I have read the note, and I agree with the clinical assessment and plan.  Dayna Geurts K Reveca Desmarais   

## 2018-04-29 NOTE — Progress Notes (Signed)
PATIENT: Jay Hall DOB: 1976-02-23  REASON FOR VISIT: follow up HISTORY FROM: patient  HISTORY OF PRESENT ILLNESS: Today 04/29/18:  Jay Hall is a 42 year old male with a history of seizures.  He returns today for follow-up.  He remains on Dilantin 350 mg daily.  He denies any seizure events.  Continue to operate a motor vehicle.  He is able to complete all ADLs independently.  His last bone density scan was in 2018 which was unremarkable.  Reports that he takes vitamin D off and on.  He returns today for evaluation.  HISTORY 04/24/17 Jay Hall is a 42 year old male with a history of seizures. He returns today for follow-up. He remains on Dilantin 350 mg daily. He reports he isn't had any seizure events. Reports that his last seizure was in 1998. He is able to complete all ADLs independently. He operates a Teacher, music without difficulty. Denies any changes in his gait or balance. He returns today for an evaluation.  REVIEW OF SYSTEMS: Out of a complete 14 system review of symptoms, the patient complains only of the following symptoms, and all other reviewed systems are negative.  See HPI  ALLERGIES: No Known Allergies  HOME MEDICATIONS: Outpatient Medications Prior to Visit  Medication Sig Dispense Refill  . Fluocinonide 0.1 % CREA APPLY TO AFFECTED AREA TWICE A DAY UNTIL HEALED  2  . Albuterol Sulfate (PROAIR RESPICLICK) 161 (90 Base) MCG/ACT AEPB Inhale 1 puff into the lungs 4 (four) times daily. 2 puffs every 4-6 hours prn for cough/ wheezing 1 each 0  . azithromycin (ZITHROMAX) 250 MG tablet 2 tabs po qd x 1 day; 1 tablet per day x 4 days; 6 tablet 0  . benzonatate (TESSALON) 100 MG capsule Take 1 capsule (100 mg total) by mouth 3 (three) times daily as needed. 20 capsule 0  . cholecalciferol (VITAMIN D) 1000 UNITS tablet Take 1,000 Units by mouth as needed.     Marland Kitchen DILANTIN 100 MG ER capsule Take 3 capsules (300 mg total) by mouth daily. 270 capsule 3  .  DILANTIN INFATABS 50 MG tablet CHEW 1 TABLET (50 MG TOTAL) BY MOUTH DAILY. 90 tablet 3   No facility-administered medications prior to visit.     PAST MEDICAL HISTORY: Past Medical History:  Diagnosis Date  . Epilepsy [facility]) 1998   Dr. Cloretta Ned    PAST SURGICAL HISTORY: Past Surgical History:  Procedure Laterality Date  . NO PAST SURGERIES      FAMILY HISTORY: Family History  Problem Relation Age of Onset  . Hypertension Mother   . Cancer Father        Prostate  . Hypertension Father   . Seizures Neg Hx     SOCIAL HISTORY: Social History   Socioeconomic History  . Marital status: Married    Spouse name: [name]  . Number of children: 2  . Years of education: BA  . Highest education level: Not on file  Occupational History  . Occupation: Engineer, structural: [employer]  Social Needs  . Financial resource strain: Not on file  . Food insecurity:    Worry: Not on file    Inability: Not on file  . Transportation needs:    Medical: Not on file    Non-medical: Not on file  Tobacco Use  . Smoking status: Never Smoker  . Smokeless tobacco: Never Used  Substance and Sexual Activity  . Alcohol use: Yes  .  Drug use: No  . Sexual activity: Yes  Lifestyle  . Physical activity:    Days per week: Not on file    Minutes per session: Not on file  . Stress: Not on file  Relationships  . Social connections:    Talks on phone: Not on file    Gets together: Not on file    Attends religious service: Not on file    Active member of club or organization: Not on file    Attends meetings of clubs or organizations: Not on file    Relationship status: Not on file  . Intimate partner violence:    Fear of current or ex partner: Not on file    Emotionally abused: Not on file    Physically abused: Not on file    Forced sexual activity: Not on file  Other Topics Concern  . Not on file  Social History Narrative   Regular exercise-no   Patient  lives at home with with [name].    Patient has 2 children.    Patient has a BA degree.       PHYSICAL EXAM  Vitals:   04/29/18 1114  BP: 119/78  Pulse: 82  Weight: 184 lb 6.4 oz (83.6 kg)  Height: 6' (1.829 m)   Body mass index is 25.01 kg/m.  Generalized: Well developed, in no acute distress   Neurological examination  Mentation: Alert oriented to time, place, history taking. Follows all commands speech and language fluent Cranial nerve II-XII: Pupils were equal round reactive to light. Extraocular movements were full, visual field were full on confrontational test. Facial sensation and strength were normal. Uvula tongue midline. Head turning and shoulder shrug  were normal and symmetric. Motor: The motor testing reveals 5 over 5 strength of all 4 extremities. Good symmetric motor tone is noted throughout.  Sensory: Sensory testing is intact to soft touch on all 4 extremities. No evidence of extinction is noted.  Coordination: Cerebellar testing reveals good finger-nose-finger and heel-to-shin bilaterally.  Gait and station: Gait is normal.  Reflexes: Deep tendon reflexes are symmetric and normal bilaterally.   DIAGNOSTIC DATA (LABS, IMAGING, TESTING) - I reviewed patient records, labs, notes, testing and imaging myself where available.  Lab Results  Component Value Date   WBC 3.2 (L) 04/24/2017   HGB 14.8 04/24/2017   HCT 42.4 04/24/2017   MCV 91 04/24/2017   PLT 194 04/24/2017      Component Value Date/Time   NA 141 04/24/2017 0838   K 4.5 04/24/2017 0838   CL 100 04/24/2017 0838   CO2 26 04/24/2017 0838   GLUCOSE 94 04/24/2017 0838   GLUCOSE 95 10/19/2016 0909   BUN 15 04/24/2017 0838   CREATININE 1.09 04/24/2017 0838   CALCIUM 9.5 04/24/2017 0838   PROT 6.7 04/24/2017 0838   ALBUMIN 4.5 04/24/2017 0838   AST 23 04/24/2017 0838   ALT 21 04/24/2017 0838   ALKPHOS 48 04/24/2017 0838   BILITOT 0.3 04/24/2017 0838   GFRNONAA 84 04/24/2017 0838   GFRAA 97  04/24/2017 0838   Lab Results  Component Value Date   CHOL 148 10/19/2016   HDL 45.70 10/19/2016   LDLCALC 89 10/19/2016   TRIG 70.0 10/19/2016   CHOLHDL 3 10/19/2016   No results found for: HGBA1C Lab Results  Component Value Date   VITAMINB12 241 02/15/2010   Lab Results  Component Value Date   TSH 1.23 10/19/2016      ASSESSMENT AND PLAN 42 y.o. year  old male  has a past medical history of Epilepsy ([place]) (1998). here with:  1.  Seizures  Overall the patient is doing well.  He will continue on Dilantin 300 mg daily.  I will check drug levels today.  He is advised that if his symptoms worsen or he has any seizure events he should let us know.  He will follow-up in 1 year or sooner if needed.   I spent 15 minutes with the patient. 50% of this time was spent reviewing plan of care   Ward Givens, MSN, NP-C 04/29/2018, 11:58 AM [facility] [address] [place], [place] [address] [phone]

## 2018-04-29 NOTE — Patient Instructions (Signed)
Your Plan: ? ?Continue Dilantin  ?Blood work today ?If your symptoms worsen or you develop new symptoms please let us know.  ? ? ?Thank you for coming to see us at [facility]. I hope we have been able to provide you high quality care today. ? ?You may receive a patient satisfaction survey over the next few weeks. We would appreciate your feedback and comments so that we may continue to improve ourselves and the health of our patients. ? ?

## 2018-04-30 ENCOUNTER — Encounter: Payer: Self-pay | Admitting: *Deleted

## 2018-04-30 LAB — COMPREHENSIVE METABOLIC PANEL
A/G RATIO: 2.2 (ref 1.2–2.2)
ALBUMIN: 4.7 g/dL (ref 3.5–5.5)
ALT: 22 IU/L (ref 0–44)
AST: 17 IU/L (ref 0–40)
Alkaline Phosphatase: 61 IU/L (ref 39–117)
BUN / CREAT RATIO: 23 — AB (ref 9–20)
BUN: 24 mg/dL (ref 6–24)
CALCIUM: 9.2 mg/dL (ref 8.7–10.2)
CHLORIDE: 103 mmol/L (ref 96–106)
CO2: 25 mmol/L (ref 20–29)
Creatinine, Ser: 1.03 mg/dL (ref 0.76–1.27)
GFR, EST AFRICAN AMERICAN: 103 mL/min/{1.73_m2} (ref >59)
GFR, EST NON AFRICAN AMERICAN: 89 mL/min/{1.73_m2} (ref >59)
GLOBULIN, TOTAL: 2.1 g/dL (ref 1.5–4.5)
Glucose: 88 mg/dL (ref 65–99)
POTASSIUM: 4.6 mmol/L (ref 3.5–5.2)
Sodium: 142 mmol/L (ref 134–144)
TOTAL PROTEIN: 6.8 g/dL (ref 6.0–8.5)

## 2018-04-30 LAB — CBC WITH DIFFERENTIAL/PLATELET
BASOS: 1 %
Basophils Absolute: 0 10*3/uL (ref 0.0–0.2)
EOS (ABSOLUTE): 0.1 10*3/uL (ref 0.0–0.4)
Eos: 2 %
HEMATOCRIT: 43.9 % (ref 37.5–51.0)
Hemoglobin: 15.1 g/dL (ref 13.0–17.7)
IMMATURE GRANS (ABS): 0 10*3/uL (ref 0.0–0.1)
IMMATURE GRANULOCYTES: 0 %
Lymphocytes Absolute: 1.4 10*3/uL (ref 0.7–3.1)
Lymphs: 36 %
MCH: 31.4 pg (ref 26.6–33.0)
MCHC: 34.4 g/dL (ref 31.5–35.7)
MCV: 91 fL (ref 79–97)
MONOS ABS: 0.3 10*3/uL (ref 0.1–0.9)
Monocytes: 7 %
NEUTROS PCT: 54 %
Neutrophils Absolute: 2.2 10*3/uL (ref 1.4–7.0)
PLATELETS: 192 10*3/uL (ref 150–450)
RBC: 4.81 x10E6/uL (ref 4.14–5.80)
RDW: 13.1 % (ref 12.3–15.4)
WBC: 4 10*3/uL (ref 3.4–10.8)

## 2018-04-30 LAB — PHENYTOIN LEVEL, TOTAL: Phenytoin (Dilantin), Serum: 11.2 ug/mL (ref 10.0–20.0)

## 2018-06-28 DIAGNOSIS — M531 Cervicobrachial syndrome: Secondary | ICD-10-CM | POA: Diagnosis not present

## 2018-06-28 DIAGNOSIS — M9903 Segmental and somatic dysfunction of lumbar region: Secondary | ICD-10-CM | POA: Diagnosis not present

## 2018-06-28 DIAGNOSIS — M9901 Segmental and somatic dysfunction of cervical region: Secondary | ICD-10-CM | POA: Diagnosis not present

## 2018-06-28 DIAGNOSIS — M9902 Segmental and somatic dysfunction of thoracic region: Secondary | ICD-10-CM | POA: Diagnosis not present

## 2018-07-07 ENCOUNTER — Other Ambulatory Visit: Payer: Self-pay | Admitting: Adult Health

## 2018-08-12 DIAGNOSIS — M9901 Segmental and somatic dysfunction of cervical region: Secondary | ICD-10-CM | POA: Diagnosis not present

## 2018-08-12 DIAGNOSIS — M9903 Segmental and somatic dysfunction of lumbar region: Secondary | ICD-10-CM | POA: Diagnosis not present

## 2018-08-12 DIAGNOSIS — M9902 Segmental and somatic dysfunction of thoracic region: Secondary | ICD-10-CM | POA: Diagnosis not present

## 2018-08-12 DIAGNOSIS — M531 Cervicobrachial syndrome: Secondary | ICD-10-CM | POA: Diagnosis not present

## 2018-11-28 DIAGNOSIS — M531 Cervicobrachial syndrome: Secondary | ICD-10-CM | POA: Diagnosis not present

## 2018-11-28 DIAGNOSIS — M9901 Segmental and somatic dysfunction of cervical region: Secondary | ICD-10-CM | POA: Diagnosis not present

## 2018-11-28 DIAGNOSIS — M9902 Segmental and somatic dysfunction of thoracic region: Secondary | ICD-10-CM | POA: Diagnosis not present

## 2018-11-28 DIAGNOSIS — M9903 Segmental and somatic dysfunction of lumbar region: Secondary | ICD-10-CM | POA: Diagnosis not present

## 2019-01-03 DIAGNOSIS — M531 Cervicobrachial syndrome: Secondary | ICD-10-CM | POA: Diagnosis not present

## 2019-01-03 DIAGNOSIS — M9901 Segmental and somatic dysfunction of cervical region: Secondary | ICD-10-CM | POA: Diagnosis not present

## 2019-01-03 DIAGNOSIS — M9902 Segmental and somatic dysfunction of thoracic region: Secondary | ICD-10-CM | POA: Diagnosis not present

## 2019-01-03 DIAGNOSIS — M9903 Segmental and somatic dysfunction of lumbar region: Secondary | ICD-10-CM | POA: Diagnosis not present

## 2019-02-14 DIAGNOSIS — M531 Cervicobrachial syndrome: Secondary | ICD-10-CM | POA: Diagnosis not present

## 2019-02-14 DIAGNOSIS — M9902 Segmental and somatic dysfunction of thoracic region: Secondary | ICD-10-CM | POA: Diagnosis not present

## 2019-02-14 DIAGNOSIS — M9903 Segmental and somatic dysfunction of lumbar region: Secondary | ICD-10-CM | POA: Diagnosis not present

## 2019-02-14 DIAGNOSIS — M9901 Segmental and somatic dysfunction of cervical region: Secondary | ICD-10-CM | POA: Diagnosis not present

## 2019-03-19 DIAGNOSIS — M9903 Segmental and somatic dysfunction of lumbar region: Secondary | ICD-10-CM | POA: Diagnosis not present

## 2019-03-19 DIAGNOSIS — M9901 Segmental and somatic dysfunction of cervical region: Secondary | ICD-10-CM | POA: Diagnosis not present

## 2019-03-19 DIAGNOSIS — M9902 Segmental and somatic dysfunction of thoracic region: Secondary | ICD-10-CM | POA: Diagnosis not present

## 2019-03-19 DIAGNOSIS — M531 Cervicobrachial syndrome: Secondary | ICD-10-CM | POA: Diagnosis not present

## 2019-04-18 DIAGNOSIS — M9901 Segmental and somatic dysfunction of cervical region: Secondary | ICD-10-CM | POA: Diagnosis not present

## 2019-04-18 DIAGNOSIS — M9903 Segmental and somatic dysfunction of lumbar region: Secondary | ICD-10-CM | POA: Diagnosis not present

## 2019-04-18 DIAGNOSIS — M531 Cervicobrachial syndrome: Secondary | ICD-10-CM | POA: Diagnosis not present

## 2019-04-18 DIAGNOSIS — M9902 Segmental and somatic dysfunction of thoracic region: Secondary | ICD-10-CM | POA: Diagnosis not present

## 2019-05-05 ENCOUNTER — Ambulatory Visit: Payer: BLUE CROSS/BLUE SHIELD | Admitting: Adult Health

## 2019-06-04 DIAGNOSIS — M9903 Segmental and somatic dysfunction of lumbar region: Secondary | ICD-10-CM | POA: Diagnosis not present

## 2019-06-04 DIAGNOSIS — M9901 Segmental and somatic dysfunction of cervical region: Secondary | ICD-10-CM | POA: Diagnosis not present

## 2019-06-04 DIAGNOSIS — M531 Cervicobrachial syndrome: Secondary | ICD-10-CM | POA: Diagnosis not present

## 2019-06-04 DIAGNOSIS — M9902 Segmental and somatic dysfunction of thoracic region: Secondary | ICD-10-CM | POA: Diagnosis not present

## 2019-06-27 ENCOUNTER — Other Ambulatory Visit: Payer: Self-pay | Admitting: Adult Health

## 2019-07-02 DIAGNOSIS — M9902 Segmental and somatic dysfunction of thoracic region: Secondary | ICD-10-CM | POA: Diagnosis not present

## 2019-07-02 DIAGNOSIS — M9901 Segmental and somatic dysfunction of cervical region: Secondary | ICD-10-CM | POA: Diagnosis not present

## 2019-07-02 DIAGNOSIS — M9903 Segmental and somatic dysfunction of lumbar region: Secondary | ICD-10-CM | POA: Diagnosis not present

## 2019-07-02 DIAGNOSIS — M531 Cervicobrachial syndrome: Secondary | ICD-10-CM | POA: Diagnosis not present

## 2019-07-23 ENCOUNTER — Other Ambulatory Visit: Payer: Self-pay | Admitting: Adult Health

## 2019-07-24 ENCOUNTER — Ambulatory Visit: Payer: BLUE CROSS/BLUE SHIELD | Admitting: Adult Health

## 2019-07-24 ENCOUNTER — Other Ambulatory Visit: Payer: Self-pay

## 2019-07-24 ENCOUNTER — Encounter: Payer: Self-pay | Admitting: Adult Health

## 2019-07-24 VITALS — BP 130/91 | HR 84 | Temp 97.2°F | Ht 72.0 in | Wt 178.8 lb

## 2019-07-24 DIAGNOSIS — Z79899 Other long term (current) drug therapy: Secondary | ICD-10-CM

## 2019-07-24 DIAGNOSIS — R569 Unspecified convulsions: Secondary | ICD-10-CM | POA: Diagnosis not present

## 2019-07-24 DIAGNOSIS — Z5181 Encounter for therapeutic drug level monitoring: Secondary | ICD-10-CM

## 2019-07-24 MED ORDER — DILANTIN 100 MG PO CAPS: 300.0000 mg | ORAL_CAPSULE | Freq: Every day | ORAL | 3 refills | Status: DC

## 2019-07-24 MED ORDER — DILANTIN INFATABS 50 MG PO CHEW: CHEWABLE_TABLET | ORAL | 3 refills | Status: DC

## 2019-07-24 NOTE — Progress Notes (Signed)
PATIENT: Jay Hall DOB: 1975/10/19  REASON FOR VISIT: follow up HISTORY FROM: patient  HISTORY OF PRESENT ILLNESS: Today 07/24/19:  [name] is a 44 year old male with a history of seizures.  He returns today for follow-up.  He continues on Dilantin 350 mg daily.  He denies any seizure events.  Reports that he tolerates the medication well although it is expensive.  He operates a Teacher, music without difficulty.  Denies any changes with his gait or balance.  He returns today for an evaluation.  04/29/18:  [name] is a 44 year old male with a history of seizures.  He returns today for follow-up.  He remains on Dilantin 350 mg daily.  He denies any seizure events.  Continue to operate a motor vehicle.  He is able to complete all ADLs independently.  His last bone density scan was in 2018 which was unremarkable.  Reports that he takes vitamin D off and on.  He returns today for evaluation.   REVIEW OF SYSTEMS: Out of a complete 14 system review of symptoms, the patient complains only of the following symptoms, and all other reviewed systems are negative.  See HPI  ALLERGIES: No Known Allergies  HOME MEDICATIONS: Outpatient Medications Prior to Visit  Medication Sig Dispense Refill  . DILANTIN 100 MG ER capsule Take 3 capsules (300 mg total) by mouth daily. 270 capsule 3  . DILANTIN INFATABS 50 MG tablet CHEW 1 TABLET DAILY 90 tablet 0  . Fluocinonide 0.1 % CREA APPLY TO AFFECTED AREA TWICE A DAY UNTIL HEALED  2   No facility-administered medications prior to visit.    PAST MEDICAL HISTORY: Past Medical History:  Diagnosis Date  . Epilepsy [facility]) 1998   Dr. Cloretta Ned    PAST SURGICAL HISTORY: Past Surgical History:  Procedure Laterality Date  . NO PAST SURGERIES      FAMILY HISTORY: Family History  Problem Relation Age of Onset  . Hypertension Mother   . Cancer Father        Prostate  . Hypertension Father   . Seizures Neg Hx     SOCIAL  HISTORY: Social History   Socioeconomic History  . Marital status: Married    Spouse name: Jay Hall  . Number of children: 2  . Years of education: BA  . Highest education level: Not on file  Occupational History  . Occupation: Engineer, structural: [employer]  Tobacco Use  . Smoking status: Never Smoker  . Smokeless tobacco: Never Used  Substance and Sexual Activity  . Alcohol use: Yes  . Drug use: No  . Sexual activity: Yes  Other Topics Concern  . Not on file  Social History Narrative   Regular exercise-no   Patient lives at home with with Jay Hall.    Patient has 2 children.    Patient has a BA degree.    Social Determinants of Health   Financial Resource Strain:   . Difficulty of Paying Living Expenses: Not on file  Food Insecurity:   . Worried About Charity fundraiser in the Last Year: Not on file  . Ran Out of Food in the Last Year: Not on file  Transportation Needs:   . Lack of Transportation (Medical): Not on file  . Lack of Transportation (Non-Medical): Not on file  Physical Activity:   . Days of Exercise per Week: Not on file  . Minutes of Exercise per Session: Not on file  Stress:   .  Feeling of Stress : Not on file  Social Connections:   . Frequency of Communication with Friends and Family: Not on file  . Frequency of Social Gatherings with Friends and Family: Not on file  . Attends Religious Services: Not on file  . Active Member of Clubs or Organizations: Not on file  . Attends Archivist Meetings: Not on file  . Marital Status: Not on file  Intimate Partner Violence:   . Fear of Current or Ex-Partner: Not on file  . Emotionally Abused: Not on file  . Physically Abused: Not on file  . Sexually Abused: Not on file      PHYSICAL EXAM  Vitals:   07/24/19 0823  BP: (!) 130/91  Pulse: 84  Temp: (!) 97.2 F (36.2 C)  TempSrc: Oral  Weight: 178 lb 12.8 oz (81.1 kg)  Height: 6' (1.829 m)   Body mass  index is 24.25 kg/m.  Generalized: Well developed, in no acute distress   Neurological examination  Mentation: Alert oriented to time, place, history taking. Follows all commands speech and language fluent Cranial nerve II-XII: Pupils were equal round reactive to light. Extraocular movements were full, visual field were full on confrontational test.  Head turning and shoulder shrug  were normal and symmetric. Motor: The motor testing reveals 5 over 5 strength of all 4 extremities. Good symmetric motor tone is noted throughout.  Sensory: Sensory testing is intact to soft touch on all 4 extremities. No evidence of extinction is noted.  Coordination: Cerebellar testing reveals good finger-nose-finger and heel-to-shin bilaterally.  Gait and station: Gait is normal. Tandem gait is normal. Romberg is negative. No drift is seen.  Reflexes: Deep tendon reflexes are symmetric and normal bilaterally.   DIAGNOSTIC DATA (LABS, IMAGING, TESTING) - I reviewed patient records, labs, notes, testing and imaging myself where available.  Lab Results  Component Value Date   WBC 4.0 04/29/2018   HGB 15.1 04/29/2018   HCT 43.9 04/29/2018   MCV 91 04/29/2018   PLT 192 04/29/2018      Component Value Date/Time   NA 142 04/29/2018 1200   K 4.6 04/29/2018 1200   CL 103 04/29/2018 1200   CO2 25 04/29/2018 1200   GLUCOSE 88 04/29/2018 1200   GLUCOSE 95 10/19/2016 0909   BUN 24 04/29/2018 1200   CREATININE 1.03 04/29/2018 1200   CALCIUM 9.2 04/29/2018 1200   PROT 6.8 04/29/2018 1200   ALBUMIN 4.7 04/29/2018 1200   AST 17 04/29/2018 1200   ALT 22 04/29/2018 1200   ALKPHOS 61 04/29/2018 1200   BILITOT <0.2 04/29/2018 1200   GFRNONAA 89 04/29/2018 1200   GFRAA 103 04/29/2018 1200   Lab Results  Component Value Date   CHOL 148 10/19/2016   HDL 45.70 10/19/2016   LDLCALC 89 10/19/2016   TRIG 70.0 10/19/2016   CHOLHDL 3 10/19/2016   No results found for: HGBA1C Lab Results  Component Value Date    VITAMINB12 241 02/15/2010   Lab Results  Component Value Date   TSH 1.23 10/19/2016      ASSESSMENT AND PLAN 44 y.o. year old male  has a past medical history of Epilepsy ([place]) (1998). here with :  1.  Seizures  -Continue Dilantin 350 mg daily -Blood work today -Advised if he has any seizure events he should let us know. -Follow-up in 1 year or sooner if needed   I spent 15 minutes with the patient. 50% of this time was spent reviewing plan  of care   Ward Givens, MSN, NP-C 07/24/2019, 8:32 AM [facility] [address] [place], [place] [address] [phone]

## 2019-07-24 NOTE — Patient Instructions (Signed)
Continue Dilantin  Blood work today If you have any seizure events please let us know.    

## 2019-07-25 LAB — COMPREHENSIVE METABOLIC PANEL
ALT: 20 IU/L (ref 0–44)
AST: 19 IU/L (ref 0–40)
Albumin/Globulin Ratio: 2.4 — ABNORMAL HIGH (ref 1.2–2.2)
Albumin: 5 g/dL (ref 4.0–5.0)
Alkaline Phosphatase: 55 IU/L (ref 39–117)
BUN/Creatinine Ratio: 15 (ref 9–20)
BUN: 15 mg/dL (ref 6–24)
Bilirubin Total: 0.3 mg/dL (ref 0.0–1.2)
CO2: 27 mmol/L (ref 20–29)
Calcium: 9.5 mg/dL (ref 8.7–10.2)
Chloride: 103 mmol/L (ref 96–106)
Creatinine, Ser: 1.01 mg/dL (ref 0.76–1.27)
GFR calc Af Amer: 105 mL/min/{1.73_m2} (ref >59)
GFR calc non Af Amer: 91 mL/min/{1.73_m2} (ref >59)
Globulin, Total: 2.1 g/dL (ref 1.5–4.5)
Glucose: 93 mg/dL (ref 65–99)
Potassium: 4.8 mmol/L (ref 3.5–5.2)
Sodium: 141 mmol/L (ref 134–144)
Total Protein: 7.1 g/dL (ref 6.0–8.5)

## 2019-07-25 LAB — CBC WITH DIFFERENTIAL/PLATELET
Basophils Absolute: 0 10*3/uL (ref 0.0–0.2)
Basos: 1 %
EOS (ABSOLUTE): 0.1 10*3/uL (ref 0.0–0.4)
Eos: 4 %
Hematocrit: 47.5 % (ref 37.5–51.0)
Hemoglobin: 15.8 g/dL (ref 13.0–17.7)
Immature Grans (Abs): 0 10*3/uL (ref 0.0–0.1)
Immature Granulocytes: 0 %
Lymphocytes Absolute: 1.4 10*3/uL (ref 0.7–3.1)
Lymphs: 43 %
MCH: 31.7 pg (ref 26.6–33.0)
MCHC: 33.3 g/dL (ref 31.5–35.7)
MCV: 95 fL (ref 79–97)
Monocytes Absolute: 0.3 10*3/uL (ref 0.1–0.9)
Monocytes: 9 %
Neutrophils Absolute: 1.5 10*3/uL (ref 1.4–7.0)
Neutrophils: 43 %
Platelets: 197 10*3/uL (ref 150–450)
RBC: 4.98 x10E6/uL (ref 4.14–5.80)
RDW: 12.9 % (ref 11.6–15.4)
WBC: 3.3 10*3/uL — ABNORMAL LOW (ref 3.4–10.8)

## 2019-07-25 LAB — PHENYTOIN LEVEL, TOTAL: Phenytoin (Dilantin), Serum: 13 ug/mL (ref 10.0–20.0)

## 2019-07-25 NOTE — Progress Notes (Signed)
I have read the note, and I agree with the clinical assessment and plan.  Naven Giambalvo K Tokiko Diefenderfer   

## 2019-07-28 ENCOUNTER — Telehealth: Payer: Self-pay

## 2019-07-28 ENCOUNTER — Telehealth: Payer: Self-pay | Admitting: Adult Health

## 2019-07-28 MED ORDER — DILANTIN INFATABS 50 MG PO CHEW: CHEWABLE_TABLET | ORAL | 3 refills | Status: DC

## 2019-07-28 MED ORDER — DILANTIN 100 MG PO CAPS: 300.0000 mg | ORAL_CAPSULE | Freq: Every day | ORAL | 3 refills | Status: DC

## 2019-07-28 NOTE — Telephone Encounter (Signed)
Unable to get in contact with the patient. LVM letting the patient know that his lab work came back normal. Office number was provided in case he has any questions or concerns.

## 2019-07-28 NOTE — Telephone Encounter (Signed)
-----   Message from Ward Givens, NP sent at 07/28/2019  1:42 PM EST ----- Labs results are unremarkable. Please call patient with results.

## 2019-07-28 NOTE — Telephone Encounter (Signed)
Pt is asking for for a revised script for his Dilantin .  Pt is asking for brand name only for this medication to be sent to his pharmacy [facility] #[id number]

## 2019-07-28 NOTE — Addendum Note (Signed)
Addended by: Brandon Melnick on: 07/28/2019 11:28 AM   Modules accepted: Orders

## 2019-07-30 DIAGNOSIS — M531 Cervicobrachial syndrome: Secondary | ICD-10-CM | POA: Diagnosis not present

## 2019-07-30 DIAGNOSIS — M9901 Segmental and somatic dysfunction of cervical region: Secondary | ICD-10-CM | POA: Diagnosis not present

## 2019-07-30 DIAGNOSIS — M9903 Segmental and somatic dysfunction of lumbar region: Secondary | ICD-10-CM | POA: Diagnosis not present

## 2019-07-30 DIAGNOSIS — M9902 Segmental and somatic dysfunction of thoracic region: Secondary | ICD-10-CM | POA: Diagnosis not present

## 2019-08-28 DIAGNOSIS — M9903 Segmental and somatic dysfunction of lumbar region: Secondary | ICD-10-CM | POA: Diagnosis not present

## 2019-08-28 DIAGNOSIS — M9902 Segmental and somatic dysfunction of thoracic region: Secondary | ICD-10-CM | POA: Diagnosis not present

## 2019-08-28 DIAGNOSIS — M9901 Segmental and somatic dysfunction of cervical region: Secondary | ICD-10-CM | POA: Diagnosis not present

## 2019-08-28 DIAGNOSIS — M531 Cervicobrachial syndrome: Secondary | ICD-10-CM | POA: Diagnosis not present

## 2019-09-29 ENCOUNTER — Telehealth: Payer: Self-pay | Admitting: Adult Health

## 2019-09-29 NOTE — Telephone Encounter (Signed)
Pt will call pharmacy, I relayed that prescription was escribed as DAW.  He verbalized understanding.

## 2019-09-29 NOTE — Telephone Encounter (Signed)
1) Medication(s) Requested (by name): DILANTIN INFATABS 50 MG tablet   2) Pharmacy of [facility]: [id number] - [place], [address]  [place], [place] [address]   3) Special Requests: Patient called stating he needs a new prescription sent to pharmacy stating only brand name on it in order to get it filled correctly

## 2019-10-01 DIAGNOSIS — M9903 Segmental and somatic dysfunction of lumbar region: Secondary | ICD-10-CM | POA: Diagnosis not present

## 2019-10-01 DIAGNOSIS — M9901 Segmental and somatic dysfunction of cervical region: Secondary | ICD-10-CM | POA: Diagnosis not present

## 2019-10-01 DIAGNOSIS — M531 Cervicobrachial syndrome: Secondary | ICD-10-CM | POA: Diagnosis not present

## 2019-10-01 DIAGNOSIS — M9902 Segmental and somatic dysfunction of thoracic region: Secondary | ICD-10-CM | POA: Diagnosis not present

## 2019-10-02 ENCOUNTER — Other Ambulatory Visit: Payer: Self-pay | Admitting: Adult Health

## 2019-10-29 DIAGNOSIS — M9902 Segmental and somatic dysfunction of thoracic region: Secondary | ICD-10-CM | POA: Diagnosis not present

## 2019-10-29 DIAGNOSIS — M9903 Segmental and somatic dysfunction of lumbar region: Secondary | ICD-10-CM | POA: Diagnosis not present

## 2019-10-29 DIAGNOSIS — M531 Cervicobrachial syndrome: Secondary | ICD-10-CM | POA: Diagnosis not present

## 2019-10-29 DIAGNOSIS — M9901 Segmental and somatic dysfunction of cervical region: Secondary | ICD-10-CM | POA: Diagnosis not present

## 2019-11-26 DIAGNOSIS — M9903 Segmental and somatic dysfunction of lumbar region: Secondary | ICD-10-CM | POA: Diagnosis not present

## 2019-11-26 DIAGNOSIS — M9902 Segmental and somatic dysfunction of thoracic region: Secondary | ICD-10-CM | POA: Diagnosis not present

## 2019-11-26 DIAGNOSIS — M9901 Segmental and somatic dysfunction of cervical region: Secondary | ICD-10-CM | POA: Diagnosis not present

## 2019-11-26 DIAGNOSIS — M531 Cervicobrachial syndrome: Secondary | ICD-10-CM | POA: Diagnosis not present

## 2019-12-18 DIAGNOSIS — D1801 Hemangioma of skin and subcutaneous tissue: Secondary | ICD-10-CM | POA: Diagnosis not present

## 2019-12-18 DIAGNOSIS — L821 Other seborrheic keratosis: Secondary | ICD-10-CM | POA: Diagnosis not present

## 2019-12-18 DIAGNOSIS — B078 Other viral warts: Secondary | ICD-10-CM | POA: Diagnosis not present

## 2019-12-18 DIAGNOSIS — L814 Other melanin hyperpigmentation: Secondary | ICD-10-CM | POA: Diagnosis not present

## 2019-12-18 DIAGNOSIS — D225 Melanocytic nevi of trunk: Secondary | ICD-10-CM | POA: Diagnosis not present

## 2019-12-24 DIAGNOSIS — M531 Cervicobrachial syndrome: Secondary | ICD-10-CM | POA: Diagnosis not present

## 2019-12-24 DIAGNOSIS — M9902 Segmental and somatic dysfunction of thoracic region: Secondary | ICD-10-CM | POA: Diagnosis not present

## 2019-12-24 DIAGNOSIS — M9903 Segmental and somatic dysfunction of lumbar region: Secondary | ICD-10-CM | POA: Diagnosis not present

## 2019-12-24 DIAGNOSIS — M9901 Segmental and somatic dysfunction of cervical region: Secondary | ICD-10-CM | POA: Diagnosis not present

## 2020-01-23 DIAGNOSIS — M9902 Segmental and somatic dysfunction of thoracic region: Secondary | ICD-10-CM | POA: Diagnosis not present

## 2020-01-23 DIAGNOSIS — M9901 Segmental and somatic dysfunction of cervical region: Secondary | ICD-10-CM | POA: Diagnosis not present

## 2020-01-23 DIAGNOSIS — M531 Cervicobrachial syndrome: Secondary | ICD-10-CM | POA: Diagnosis not present

## 2020-01-23 DIAGNOSIS — M9903 Segmental and somatic dysfunction of lumbar region: Secondary | ICD-10-CM | POA: Diagnosis not present

## 2020-03-16 DIAGNOSIS — M9903 Segmental and somatic dysfunction of lumbar region: Secondary | ICD-10-CM | POA: Diagnosis not present

## 2020-03-16 DIAGNOSIS — M9901 Segmental and somatic dysfunction of cervical region: Secondary | ICD-10-CM | POA: Diagnosis not present

## 2020-03-16 DIAGNOSIS — M9902 Segmental and somatic dysfunction of thoracic region: Secondary | ICD-10-CM | POA: Diagnosis not present

## 2020-03-16 DIAGNOSIS — M531 Cervicobrachial syndrome: Secondary | ICD-10-CM | POA: Diagnosis not present

## 2020-04-15 DIAGNOSIS — M531 Cervicobrachial syndrome: Secondary | ICD-10-CM | POA: Diagnosis not present

## 2020-04-15 DIAGNOSIS — M9901 Segmental and somatic dysfunction of cervical region: Secondary | ICD-10-CM | POA: Diagnosis not present

## 2020-04-15 DIAGNOSIS — M9902 Segmental and somatic dysfunction of thoracic region: Secondary | ICD-10-CM | POA: Diagnosis not present

## 2020-04-15 DIAGNOSIS — M9903 Segmental and somatic dysfunction of lumbar region: Secondary | ICD-10-CM | POA: Diagnosis not present

## 2020-05-13 DIAGNOSIS — M9901 Segmental and somatic dysfunction of cervical region: Secondary | ICD-10-CM | POA: Diagnosis not present

## 2020-05-13 DIAGNOSIS — M531 Cervicobrachial syndrome: Secondary | ICD-10-CM | POA: Diagnosis not present

## 2020-05-13 DIAGNOSIS — M9903 Segmental and somatic dysfunction of lumbar region: Secondary | ICD-10-CM | POA: Diagnosis not present

## 2020-05-13 DIAGNOSIS — M9902 Segmental and somatic dysfunction of thoracic region: Secondary | ICD-10-CM | POA: Diagnosis not present

## 2020-06-14 DIAGNOSIS — M9901 Segmental and somatic dysfunction of cervical region: Secondary | ICD-10-CM | POA: Diagnosis not present

## 2020-06-14 DIAGNOSIS — M531 Cervicobrachial syndrome: Secondary | ICD-10-CM | POA: Diagnosis not present

## 2020-06-14 DIAGNOSIS — M9902 Segmental and somatic dysfunction of thoracic region: Secondary | ICD-10-CM | POA: Diagnosis not present

## 2020-06-14 DIAGNOSIS — M9903 Segmental and somatic dysfunction of lumbar region: Secondary | ICD-10-CM | POA: Diagnosis not present

## 2020-07-23 DIAGNOSIS — M531 Cervicobrachial syndrome: Secondary | ICD-10-CM | POA: Diagnosis not present

## 2020-07-23 DIAGNOSIS — M9903 Segmental and somatic dysfunction of lumbar region: Secondary | ICD-10-CM | POA: Diagnosis not present

## 2020-07-23 DIAGNOSIS — M9902 Segmental and somatic dysfunction of thoracic region: Secondary | ICD-10-CM | POA: Diagnosis not present

## 2020-07-23 DIAGNOSIS — M9901 Segmental and somatic dysfunction of cervical region: Secondary | ICD-10-CM | POA: Diagnosis not present

## 2020-07-26 ENCOUNTER — Encounter: Payer: Self-pay | Admitting: Adult Health

## 2020-07-26 ENCOUNTER — Ambulatory Visit: Payer: BC Managed Care – PPO | Admitting: Adult Health

## 2020-07-26 VITALS — BP 123/79 | HR 73 | Ht 72.0 in | Wt 181.0 lb

## 2020-07-26 DIAGNOSIS — Z79899 Other long term (current) drug therapy: Secondary | ICD-10-CM | POA: Diagnosis not present

## 2020-07-26 DIAGNOSIS — R569 Unspecified convulsions: Secondary | ICD-10-CM | POA: Diagnosis not present

## 2020-07-26 DIAGNOSIS — Z5181 Encounter for therapeutic drug level monitoring: Secondary | ICD-10-CM | POA: Diagnosis not present

## 2020-07-26 MED ORDER — DILANTIN INFATABS 50 MG PO CHEW: CHEWABLE_TABLET | ORAL | 3 refills | Status: DC

## 2020-07-26 MED ORDER — DILANTIN 100 MG PO CAPS: 300.0000 mg | ORAL_CAPSULE | Freq: Every day | ORAL | 3 refills | Status: DC

## 2020-07-26 NOTE — Progress Notes (Signed)
I have read the note, and I agree with the clinical assessment and plan.  Devorah Givhan K Makale Pindell   

## 2020-07-26 NOTE — Progress Notes (Signed)
PATIENT: Jay Hall DOB: [birth year]  REASON FOR VISIT: follow up HISTORY FROM: patient  HISTORY OF PRESENT ILLNESS: Today 07/26/20: [name] is a 45 year old male with a history of seizures.  He returns today for follow-up.  Reports overall he continues to do very well.  He denies any seizure events.  Continues on Dilantin 350 mg daily.  Denies any changes with his gait or balance.  Continues to complete all ADLs independently.  Operates a Teacher, music.  07/24/19:  [name] is a 45 year old male with a history of seizures.  He returns today for follow-up.  He continues on Dilantin 350 mg daily.  He denies any seizure events.  Reports that he tolerates the medication well although it is expensive.  He operates a Teacher, music without difficulty.  Denies any changes with his gait or balance.  He returns today for an evaluation.  04/29/18:  [name] is a 45 year old male with a history of seizures.  He returns today for follow-up.  He remains on Dilantin 350 mg daily.  He denies any seizure events.  Continue to operate a motor vehicle.  He is able to complete all ADLs independently.  His last bone density scan was in 2018 which was unremarkable.  Reports that he takes vitamin D off and on.  He returns today for evaluation.   REVIEW OF SYSTEMS: Out of a complete 14 system review of symptoms, the patient complains only of the following symptoms, and all other reviewed systems are negative.  See HPI  ALLERGIES: No Known Allergies  HOME MEDICATIONS: Outpatient Medications Prior to Visit  Medication Sig Dispense Refill  . DILANTIN 100 MG ER capsule Take 3 capsules (300 mg total) by mouth daily. DAW-1 270 capsule 3  . DILANTIN INFATABS 50 MG tablet CHEW 1 TABLET DAILY.  DAW-1 90 tablet 3  . Fluocinonide 0.1 % CREA APPLY TO AFFECTED AREA TWICE A DAY UNTIL HEALED  2   No facility-administered medications prior to visit.    PAST MEDICAL HISTORY: Past Medical  History:  Diagnosis Date  . Epilepsy [facility]) 1998   Dr. Cloretta Ned    PAST SURGICAL HISTORY: Past Surgical History:  Procedure Laterality Date  . NO PAST SURGERIES      FAMILY HISTORY: Family History  Problem Relation Age of Onset  . Hypertension Mother   . Cancer Father        Prostate  . Hypertension Father   . Seizures Neg Hx     SOCIAL HISTORY: Social History   Socioeconomic History  . Marital status: Married    Spouse name: [name]  . Number of children: 2  . Years of education: BA  . Highest education level: Not on file  Occupational History  . Occupation: Engineer, structural: [employer]  Tobacco Use  . Smoking status: Never Smoker  . Smokeless tobacco: Never Used  Substance and Sexual Activity  . Alcohol use: Yes  . Drug use: No  . Sexual activity: Yes  Other Topics Concern  . Not on file  Social History Narrative   Regular exercise-no   Patient lives at home with with [name].    Patient has 2 children.    Patient has a BA degree.    Social Determinants of Health   Financial Resource Strain: Not on file  Food Insecurity: Not on file  Transportation Needs: Not on file  Physical Activity: Not on file  Stress: Not on file  Social Connections: Not on file  Intimate Partner Violence: Not on file      PHYSICAL EXAM  Vitals:   07/26/20 0837  BP: 123/79  Pulse: 73  Weight: 181 lb (82.1 kg)  Height: 6' (1.829 m)   Body mass index is 24.55 kg/m.  Generalized: Well developed, in no acute distress   Neurological examination  Mentation: Alert oriented to time, place, history taking. Follows all commands speech and language fluent Cranial nerve II-XII: Pupils were equal round reactive to light. Extraocular movements were full, visual field were full on confrontational test.  Head turning and shoulder shrug  were normal and symmetric. Motor: The motor testing reveals 5 over 5 strength of all 4 extremities. Good  symmetric motor tone is noted throughout.  Sensory: Sensory testing is intact to soft touch on all 4 extremities. No evidence of extinction is noted.  Coordination: Cerebellar testing reveals good finger-nose-finger and heel-to-shin bilaterally.  Gait and station: Gait is normal. Tandem gait is normal. Romberg is negative. No drift is seen.  Reflexes: Deep tendon reflexes are symmetric and normal bilaterally.   DIAGNOSTIC DATA (LABS, IMAGING, TESTING) - I reviewed patient records, labs, notes, testing and imaging myself where available.  Lab Results  Component Value Date   WBC 3.3 (L) 07/24/2019   HGB 15.8 07/24/2019   HCT 47.5 07/24/2019   MCV 95 07/24/2019   PLT 197 07/24/2019      Component Value Date/Time   NA 141 07/24/2019 0852   K 4.8 07/24/2019 0852   CL 103 07/24/2019 0852   CO2 27 07/24/2019 0852   GLUCOSE 93 07/24/2019 0852   GLUCOSE 95 10/19/2016 0909   BUN 15 07/24/2019 0852   CREATININE 1.01 07/24/2019 0852   CALCIUM 9.5 07/24/2019 0852   PROT 7.1 07/24/2019 0852   ALBUMIN 5.0 07/24/2019 0852   AST 19 07/24/2019 0852   ALT 20 07/24/2019 0852   ALKPHOS 55 07/24/2019 0852   BILITOT 0.3 07/24/2019 0852   GFRNONAA 91 07/24/2019 0852   GFRAA 105 07/24/2019 0852   Lab Results  Component Value Date   CHOL 148 10/19/2016   HDL 45.70 10/19/2016   LDLCALC 89 10/19/2016   TRIG 70.0 10/19/2016   CHOLHDL 3 10/19/2016   No results found for: HGBA1C Lab Results  Component Value Date   VITAMINB12 241 02/15/2010   Lab Results  Component Value Date   TSH 1.23 10/19/2016      ASSESSMENT AND PLAN 45 y.o. year old male  has a past medical history of Epilepsy ([place]) (1998). here with :  1.  Seizures  -Continue Dilantin 350 mg daily -Blood work today -Advised if he has any seizure events he should let us know. -Follow-up in 1 year or sooner if needed   I spent 25 minutes of face-to-face and non-face-to-face time with patient.  This included previsit chart  review, lab review, study review, order entry, electronic health record documentation, patient education.    Ward Givens, MSN, NP-C 07/26/2020, 8:41 AM [facility] [address] [place], [place] [address] [phone]

## 2020-07-26 NOTE — Patient Instructions (Signed)
Continue Dilantin 350 mg daily  Blood work today If you have any seizure events please let us know.   

## 2020-07-27 LAB — CBC WITH DIFFERENTIAL/PLATELET
Basophils Absolute: 0 10*3/uL (ref 0.0–0.2)
Basos: 1 %
EOS (ABSOLUTE): 0.1 10*3/uL (ref 0.0–0.4)
Eos: 3 %
Hematocrit: 43.9 % (ref 37.5–51.0)
Hemoglobin: 14.7 g/dL (ref 13.0–17.7)
Immature Grans (Abs): 0 10*3/uL (ref 0.0–0.1)
Immature Granulocytes: 0 %
Lymphocytes Absolute: 1.3 10*3/uL (ref 0.7–3.1)
Lymphs: 40 %
MCH: 31.6 pg (ref 26.6–33.0)
MCHC: 33.5 g/dL (ref 31.5–35.7)
MCV: 94 fL (ref 79–97)
Monocytes Absolute: 0.3 10*3/uL (ref 0.1–0.9)
Monocytes: 8 %
Neutrophils Absolute: 1.5 10*3/uL (ref 1.4–7.0)
Neutrophils: 48 %
Platelets: 179 10*3/uL (ref 150–450)
RBC: 4.65 x10E6/uL (ref 4.14–5.80)
RDW: 12.8 % (ref 11.6–15.4)
WBC: 3.1 10*3/uL — ABNORMAL LOW (ref 3.4–10.8)

## 2020-07-27 LAB — COMPREHENSIVE METABOLIC PANEL
ALT: 20 IU/L (ref 0–44)
AST: 17 IU/L (ref 0–40)
Albumin/Globulin Ratio: 2.6 — ABNORMAL HIGH (ref 1.2–2.2)
Albumin: 4.6 g/dL (ref 4.0–5.0)
Alkaline Phosphatase: 67 IU/L (ref 44–121)
BUN/Creatinine Ratio: 22 — ABNORMAL HIGH (ref 9–20)
BUN: 20 mg/dL (ref 6–24)
Bilirubin Total: <0.2 mg/dL (ref 0.0–1.2)
CO2: 27 mmol/L (ref 20–29)
Calcium: 9.1 mg/dL (ref 8.7–10.2)
Chloride: 105 mmol/L (ref 96–106)
Creatinine, Ser: 0.93 mg/dL (ref 0.76–1.27)
GFR calc Af Amer: 115 mL/min/{1.73_m2} (ref >59)
GFR calc non Af Amer: 99 mL/min/{1.73_m2} (ref >59)
Globulin, Total: 1.8 g/dL (ref 1.5–4.5)
Glucose: 115 mg/dL — ABNORMAL HIGH (ref 65–99)
Potassium: 4.6 mmol/L (ref 3.5–5.2)
Sodium: 143 mmol/L (ref 134–144)
Total Protein: 6.4 g/dL (ref 6.0–8.5)

## 2020-07-27 LAB — PHENYTOIN LEVEL, TOTAL: Phenytoin (Dilantin), Serum: 11.6 ug/mL (ref 10.0–20.0)

## 2020-08-24 ENCOUNTER — Encounter: Payer: Self-pay | Admitting: Internal Medicine

## 2020-08-24 ENCOUNTER — Other Ambulatory Visit: Payer: Self-pay

## 2020-08-24 ENCOUNTER — Ambulatory Visit (INDEPENDENT_AMBULATORY_CARE_PROVIDER_SITE_OTHER): Payer: BC Managed Care – PPO | Admitting: Internal Medicine

## 2020-08-24 VITALS — BP 120/82 | HR 84 | Temp 98.3°F | Ht 72.0 in | Wt 178.8 lb

## 2020-08-24 DIAGNOSIS — B070 Plantar wart: Secondary | ICD-10-CM | POA: Diagnosis not present

## 2020-08-24 DIAGNOSIS — R739 Hyperglycemia, unspecified: Secondary | ICD-10-CM | POA: Insufficient documentation

## 2020-08-24 DIAGNOSIS — Z0000 Encounter for general adult medical examination without abnormal findings: Secondary | ICD-10-CM | POA: Diagnosis not present

## 2020-08-24 DIAGNOSIS — Z23 Encounter for immunization: Secondary | ICD-10-CM

## 2020-08-24 LAB — LIPID PANEL
Cholesterol: 150 mg/dL (ref 0–200)
HDL: 48.5 mg/dL (ref >39.00)
LDL Cholesterol: 80 mg/dL (ref 0–99)
NonHDL: 101.39
Total CHOL/HDL Ratio: 3
Triglycerides: 109 mg/dL (ref 0.0–149.0)
VLDL: 21.8 mg/dL (ref 0.0–40.0)

## 2020-08-24 LAB — HEPATIC FUNCTION PANEL
ALT: 20 U/L (ref 0–53)
AST: 18 U/L (ref 0–37)
Albumin: 4.7 g/dL (ref 3.5–5.2)
Alkaline Phosphatase: 52 U/L (ref 39–117)
Bilirubin, Direct: 0.1 mg/dL (ref 0.0–0.3)
Total Bilirubin: 0.5 mg/dL (ref 0.2–1.2)
Total Protein: 7 g/dL (ref 6.0–8.3)

## 2020-08-24 LAB — BASIC METABOLIC PANEL
BUN: 21 mg/dL (ref 6–23)
CO2: 33 mEq/L — ABNORMAL HIGH (ref 19–32)
Calcium: 9.8 mg/dL (ref 8.4–10.5)
Chloride: 103 mEq/L (ref 96–112)
Creatinine, Ser: 0.94 mg/dL (ref 0.40–1.50)
GFR: 98.53 mL/min (ref >60.00)
Glucose, Bld: 102 mg/dL — ABNORMAL HIGH (ref 70–99)
Potassium: 4.6 mEq/L (ref 3.5–5.1)
Sodium: 140 mEq/L (ref 135–145)

## 2020-08-24 LAB — TSH: TSH: 1.16 u[IU]/mL (ref 0.35–4.50)

## 2020-08-24 LAB — HEMOGLOBIN A1C: Hgb A1c MFr Bld: 5.4 % (ref 4.6–6.5)

## 2020-08-24 LAB — PSA: PSA: 0.96 ng/mL (ref 0.10–4.00)

## 2020-08-24 MED ORDER — VITAMIN D 25 MCG (1000 UNIT) PO TABS: 1000.0000 [IU] | ORAL_TABLET | Freq: Every day | ORAL | 11 refills | Status: AC

## 2020-08-24 NOTE — Progress Notes (Signed)
Subjective:  Patient ID: Jay Hall, male    DOB: 1976-06-08  Age: 45 y.o. MRN: [id number]  CC: Annual Exam   HPI Jay Hall presents for a well exam C/o a wart  Outpatient Medications Prior to Visit  Medication Sig Dispense Refill  . ciclopirox (PENLAC) 8 % solution Apply topically.    Marland Kitchen DILANTIN 100 MG ER capsule Take 3 capsules (300 mg total) by mouth daily. DAW-1 270 capsule 3  . DILANTIN INFATABS 50 MG tablet CHEW 1 TABLET DAILY.  DAW-1 90 tablet 3  . Fluocinonide 0.1 % CREA APPLY TO AFFECTED AREA TWICE A DAY UNTIL HEALED  2   No facility-administered medications prior to visit.    ROS: Review of Systems  Constitutional: Negative for appetite change, fatigue and unexpected weight change.  HENT: Negative for congestion, nosebleeds, sneezing, sore throat and trouble swallowing.   Eyes: Negative for itching and visual disturbance.  Respiratory: Negative for cough.   Cardiovascular: Negative for chest pain, palpitations and leg swelling.  Gastrointestinal: Negative for abdominal distention, blood in stool, diarrhea and nausea.  Genitourinary: Negative for frequency and hematuria.  Musculoskeletal: Negative for back pain, gait problem, joint swelling and neck pain.  Skin: Negative for rash.  Neurological: Negative for dizziness, tremors, speech difficulty and weakness.  Psychiatric/Behavioral: Negative for agitation, dysphoric mood and sleep disturbance. The patient is not nervous/anxious.     Objective:  BP 120/82 (BP Location: Left Arm)   Pulse 84   Temp 98.3 F (36.8 C) (Oral)   Ht 6' (1.829 m)   Wt 178 lb 12.8 oz (81.1 kg)   SpO2 98%   BMI 24.25 kg/m   BP Readings from Last 3 Encounters:  08/24/20 120/82  07/26/20 123/79  07/24/19 (!) 130/91    Wt Readings from Last 3 Encounters:  08/24/20 178 lb 12.8 oz (81.1 kg)  07/26/20 181 lb (82.1 kg)  07/24/19 178 lb 12.8 oz (81.1 kg)    Physical Exam Constitutional:      General: He is not in  acute distress.    Appearance: He is well-developed.     Comments: NAD  HENT:     Mouth/Throat:     Mouth: Oropharynx is clear and moist.  Eyes:     Conjunctiva/sclera: Conjunctivae normal.     Pupils: Pupils are equal, round, and reactive to light.  Neck:     Thyroid: No thyromegaly.     Vascular: No JVD.  Cardiovascular:     Rate and Rhythm: Normal rate and regular rhythm.     Pulses: Intact distal pulses.     Heart sounds: Normal heart sounds. No murmur heard. No friction rub. No gallop.   Pulmonary:     Effort: Pulmonary effort is normal. No respiratory distress.     Breath sounds: Normal breath sounds. No wheezing or rales.  Chest:     Chest wall: No tenderness.  Abdominal:     General: Bowel sounds are normal. There is no distension.     Palpations: Abdomen is soft. There is no mass.     Tenderness: There is no abdominal tenderness. There is no guarding or rebound.  Musculoskeletal:        General: No tenderness or edema. Normal range of motion.     Cervical back: Normal range of motion.  Lymphadenopathy:     Cervical: No cervical adenopathy.  Skin:    General: Skin is warm and dry.     Findings: No rash.  Neurological:  Mental Status: He is alert and oriented to person, place, and time.     Cranial Nerves: No cranial nerve deficit.     Motor: No abnormal muscle tone.     Coordination: He displays a negative Romberg sign. Coordination normal.     Gait: Gait normal.     Deep Tendon Reflexes: Reflexes are normal and symmetric.  Psychiatric:        Mood and Affect: Mood and affect normal.        Behavior: Behavior normal.        Thought Content: Thought content normal.        Judgment: Judgment normal.   R 5th toe   Procedure Note :     Procedure : Cryosurgery   Indication:  Wart(s)  - plantar x1 R 5th toe   Risks including unsuccessful procedure , bleeding, infection, bruising, scar, a need for a repeat  procedure and others were explained to the patient  in detail as well as the benefits. Informed consent was obtained verbally.    1 lesion(s)  on R foot   was/were treated with liquid nitrogen on a Q-tip in a usual fasion . Band-Aid was applied and antibiotic ointment was given for a later use.   Tolerated well. Complications none.   Postprocedure instructions :     Keep the wounds clean. You can wash them with liquid soap and water. Pat dry with gauze or a Kleenex tissue  Before applying antibiotic ointment and a Band-Aid.   You need to report immediately  if  any signs of infection develop.     Lab Results  Component Value Date   WBC 3.1 (L) 07/26/2020   HGB 14.7 07/26/2020   HCT 43.9 07/26/2020   PLT 179 07/26/2020   GLUCOSE 115 (H) 07/26/2020   CHOL 148 10/19/2016   TRIG 70.0 10/19/2016   HDL 45.70 10/19/2016   LDLCALC 89 10/19/2016   ALT 20 07/26/2020   AST 17 07/26/2020   NA 143 07/26/2020   K 4.6 07/26/2020   CL 105 07/26/2020   CREATININE 0.93 07/26/2020   BUN 20 07/26/2020   CO2 27 07/26/2020   TSH 1.23 10/19/2016   PSA 1.01 10/19/2016   MICROALBUR 0.8 12/06/2010    DG BONE DENSITY (DXA)  Result Date: 05/09/2017 EXAM: DUAL X-RAY ABSORPTIOMETRY (DXA) FOR BONE MINERAL DENSITY IMPRESSION: Referring Physician: Ward Givens PATIENT: Name: Jay, Hall Patient ID: [id number] Birth Date: 10-03-1975 Height: 73.0 in. Sex: Male Measured: 05/09/2017 Weight: 181.3 lbs. Indications: Caucasian, Dilantin, Estrogen Deficient Fractures: None Treatments: Multivitamin ASSESSMENT: The BMD measured at Femur Neck Right is 0.824 g/cm2 with a Z-score of -1.5. The Z-score is within the expected range for age. ISCD recommends using Z-scores for assessment of pre-menopausal women, men between 108 and 3 yoa, and children under 28 yoa. The diagnosis of osteoporosis in these patients should not be made on the basis of densitometric criteria alone. Site      Region     Measured Date Measured Age YA      AM      BMD T-score Z-score DualFemur  Neck Right 05/09/2017 41.3 -1.5 -1.5 0.824 g/cm2 AP Spine L1-L4 05/09/2017 41.3 0.6 0.3 1.272 g/cm2 RECOMMENDATION: All patients should ensure an adequate intake of dietary calcium (1200 mg/d) and vitamin D (800 IU daily) unless contraindicated. FOLLOW-UP: As clinically indicated. I have reviewed this report, and agree with the above findings. [facility] Electronically Signed   By: Kerby Moors M.D.  On: 05/09/2017 09:24    Assessment & Plan:

## 2020-08-24 NOTE — Patient Instructions (Addendum)
Cologuard vs colonoscopy >45 yo    Postprocedure instructions :     Keep the wounds clean. You can wash them with liquid soap and water. Pat dry with gauze or a Kleenex tissue  Before applying antibiotic ointment and a Band-Aid.   You need to report immediately  if  any signs of infection develop.

## 2020-08-24 NOTE — Assessment & Plan Note (Addendum)
  We discussed age appropriate health related issues, including available/recomended screening tests and vaccinations. Labs were ordered to be later reviewed . All questions were answered. We discussed one or more of the following - seat belt use, use of sunscreen/sun exposure exercise, safe sex, fall risk reduction, second hand smoke exposure, firearm use and storage, seat belt use, a need for adhering to healthy diet and exercise. Labs were ordered.  All questions were answered. Cologuard vs colonoscopy >74 yo

## 2020-08-24 NOTE — Assessment & Plan Note (Signed)
A1c

## 2020-08-29 ENCOUNTER — Encounter: Payer: Self-pay | Admitting: Internal Medicine

## 2020-09-03 DIAGNOSIS — M9903 Segmental and somatic dysfunction of lumbar region: Secondary | ICD-10-CM | POA: Diagnosis not present

## 2020-09-03 DIAGNOSIS — M531 Cervicobrachial syndrome: Secondary | ICD-10-CM | POA: Diagnosis not present

## 2020-09-03 DIAGNOSIS — M9901 Segmental and somatic dysfunction of cervical region: Secondary | ICD-10-CM | POA: Diagnosis not present

## 2020-09-03 DIAGNOSIS — M9902 Segmental and somatic dysfunction of thoracic region: Secondary | ICD-10-CM | POA: Diagnosis not present

## 2020-09-28 ENCOUNTER — Other Ambulatory Visit: Payer: Self-pay | Admitting: *Deleted

## 2020-09-28 MED ORDER — DILANTIN 100 MG PO CAPS: 300.0000 mg | ORAL_CAPSULE | Freq: Every day | ORAL | 2 refills | Status: DC

## 2020-09-30 ENCOUNTER — Telehealth: Payer: Self-pay | Admitting: *Deleted

## 2020-09-30 MED ORDER — DILANTIN INFATABS 50 MG PO CHEW: CHEWABLE_TABLET | ORAL | 3 refills | Status: DC

## 2020-09-30 NOTE — Telephone Encounter (Signed)
[facility] after [facility]. Had the 09-28-20 script Dilantin 100mg  caps.

## 2020-10-29 DIAGNOSIS — M9902 Segmental and somatic dysfunction of thoracic region: Secondary | ICD-10-CM | POA: Diagnosis not present

## 2020-10-29 DIAGNOSIS — M531 Cervicobrachial syndrome: Secondary | ICD-10-CM | POA: Diagnosis not present

## 2020-10-29 DIAGNOSIS — M9901 Segmental and somatic dysfunction of cervical region: Secondary | ICD-10-CM | POA: Diagnosis not present

## 2020-10-29 DIAGNOSIS — M9903 Segmental and somatic dysfunction of lumbar region: Secondary | ICD-10-CM | POA: Diagnosis not present

## 2020-11-25 DIAGNOSIS — M531 Cervicobrachial syndrome: Secondary | ICD-10-CM | POA: Diagnosis not present

## 2020-11-25 DIAGNOSIS — M9903 Segmental and somatic dysfunction of lumbar region: Secondary | ICD-10-CM | POA: Diagnosis not present

## 2020-11-25 DIAGNOSIS — M9901 Segmental and somatic dysfunction of cervical region: Secondary | ICD-10-CM | POA: Diagnosis not present

## 2020-11-25 DIAGNOSIS — M9902 Segmental and somatic dysfunction of thoracic region: Secondary | ICD-10-CM | POA: Diagnosis not present

## 2020-12-17 DIAGNOSIS — L989 Disorder of the skin and subcutaneous tissue, unspecified: Secondary | ICD-10-CM | POA: Diagnosis not present

## 2020-12-17 DIAGNOSIS — L570 Actinic keratosis: Secondary | ICD-10-CM | POA: Diagnosis not present

## 2020-12-17 DIAGNOSIS — B351 Tinea unguium: Secondary | ICD-10-CM | POA: Diagnosis not present

## 2020-12-17 DIAGNOSIS — D225 Melanocytic nevi of trunk: Secondary | ICD-10-CM | POA: Diagnosis not present

## 2020-12-17 DIAGNOSIS — B070 Plantar wart: Secondary | ICD-10-CM | POA: Diagnosis not present

## 2020-12-22 ENCOUNTER — Telehealth: Payer: Self-pay | Admitting: Adult Health

## 2020-12-22 NOTE — Telephone Encounter (Signed)
I called pt he is having issues with getting dilantin.  [facility] and spoke to [employer].  She stated that from her records, pt has refills on both strengths and is able to get refills.  He needs to call [phone] to start process.  Cost was $435.00 and so she would not let me renew.  I relayed all this to pt and he will call.  He verbalized understanding.

## 2020-12-22 NOTE — Telephone Encounter (Signed)
Pt called stating that his DILANTIN 100 MG ER capsule was not filled but the other DILANTIN he takes was filled. Pt would like to know why one was not filled. Please advise.

## 2020-12-23 DIAGNOSIS — M531 Cervicobrachial syndrome: Secondary | ICD-10-CM | POA: Diagnosis not present

## 2020-12-23 DIAGNOSIS — M9901 Segmental and somatic dysfunction of cervical region: Secondary | ICD-10-CM | POA: Diagnosis not present

## 2020-12-23 DIAGNOSIS — M9902 Segmental and somatic dysfunction of thoracic region: Secondary | ICD-10-CM | POA: Diagnosis not present

## 2020-12-23 DIAGNOSIS — M9903 Segmental and somatic dysfunction of lumbar region: Secondary | ICD-10-CM | POA: Diagnosis not present

## 2021-02-04 DIAGNOSIS — M531 Cervicobrachial syndrome: Secondary | ICD-10-CM | POA: Diagnosis not present

## 2021-02-04 DIAGNOSIS — M9903 Segmental and somatic dysfunction of lumbar region: Secondary | ICD-10-CM | POA: Diagnosis not present

## 2021-02-04 DIAGNOSIS — M9901 Segmental and somatic dysfunction of cervical region: Secondary | ICD-10-CM | POA: Diagnosis not present

## 2021-02-04 DIAGNOSIS — M9902 Segmental and somatic dysfunction of thoracic region: Secondary | ICD-10-CM | POA: Diagnosis not present

## 2021-03-17 DIAGNOSIS — M9903 Segmental and somatic dysfunction of lumbar region: Secondary | ICD-10-CM | POA: Diagnosis not present

## 2021-03-17 DIAGNOSIS — M9901 Segmental and somatic dysfunction of cervical region: Secondary | ICD-10-CM | POA: Diagnosis not present

## 2021-03-17 DIAGNOSIS — M9902 Segmental and somatic dysfunction of thoracic region: Secondary | ICD-10-CM | POA: Diagnosis not present

## 2021-03-17 DIAGNOSIS — M531 Cervicobrachial syndrome: Secondary | ICD-10-CM | POA: Diagnosis not present

## 2021-04-21 DIAGNOSIS — M9903 Segmental and somatic dysfunction of lumbar region: Secondary | ICD-10-CM | POA: Diagnosis not present

## 2021-04-21 DIAGNOSIS — M531 Cervicobrachial syndrome: Secondary | ICD-10-CM | POA: Diagnosis not present

## 2021-04-21 DIAGNOSIS — M9902 Segmental and somatic dysfunction of thoracic region: Secondary | ICD-10-CM | POA: Diagnosis not present

## 2021-04-21 DIAGNOSIS — M9901 Segmental and somatic dysfunction of cervical region: Secondary | ICD-10-CM | POA: Diagnosis not present

## 2021-05-26 DIAGNOSIS — M9901 Segmental and somatic dysfunction of cervical region: Secondary | ICD-10-CM | POA: Diagnosis not present

## 2021-05-26 DIAGNOSIS — M9903 Segmental and somatic dysfunction of lumbar region: Secondary | ICD-10-CM | POA: Diagnosis not present

## 2021-05-26 DIAGNOSIS — M531 Cervicobrachial syndrome: Secondary | ICD-10-CM | POA: Diagnosis not present

## 2021-05-26 DIAGNOSIS — M9902 Segmental and somatic dysfunction of thoracic region: Secondary | ICD-10-CM | POA: Diagnosis not present

## 2021-06-20 ENCOUNTER — Other Ambulatory Visit: Payer: Self-pay | Admitting: Adult Health

## 2021-07-14 DIAGNOSIS — M531 Cervicobrachial syndrome: Secondary | ICD-10-CM | POA: Diagnosis not present

## 2021-07-14 DIAGNOSIS — M9903 Segmental and somatic dysfunction of lumbar region: Secondary | ICD-10-CM | POA: Diagnosis not present

## 2021-07-14 DIAGNOSIS — M9902 Segmental and somatic dysfunction of thoracic region: Secondary | ICD-10-CM | POA: Diagnosis not present

## 2021-07-14 DIAGNOSIS — M9901 Segmental and somatic dysfunction of cervical region: Secondary | ICD-10-CM | POA: Diagnosis not present

## 2021-07-26 ENCOUNTER — Encounter: Payer: Self-pay | Admitting: Adult Health

## 2021-07-26 ENCOUNTER — Ambulatory Visit: Payer: BC Managed Care – PPO | Admitting: Adult Health

## 2021-07-26 VITALS — BP 129/88 | HR 85

## 2021-07-26 DIAGNOSIS — R569 Unspecified convulsions: Secondary | ICD-10-CM

## 2021-07-26 DIAGNOSIS — Z5181 Encounter for therapeutic drug level monitoring: Secondary | ICD-10-CM

## 2021-07-26 DIAGNOSIS — Z79899 Other long term (current) drug therapy: Secondary | ICD-10-CM

## 2021-07-26 NOTE — Progress Notes (Signed)
PATIENT: Jay Hall DOB: [birth year]  REASON FOR VISIT: follow up HISTORY FROM: patient  HISTORY OF PRESENT ILLNESS: Today 07/26/21:  Jay Hall is a 46 year old male with a history of seizures.  He returns today for follow-up.  He continues on Dilantin 3 and a 50 mg daily.  His last DEXA scan was in 2018.  He continues to take vitamin C and D supplements.  He denies any seizure events.  He is asking today about switching to another seizure medication.  He is concerned about potential long-term effects of Dilantin.  He is currently operating a motor vehicle.   07/26/20: Jay Hall is a 46 year old male with a history of seizures.  He returns today for follow-up.  Reports overall he continues to do very well.  He denies any seizure events.  Continues on Dilantin 350 mg daily.  Denies any changes with his gait or balance.  Continues to complete all ADLs independently.  Operates a Teacher, music.  07/24/19:  Jay Hall is a 46 year old male with a history of seizures.  He returns today for follow-up.  He continues on Dilantin 350 mg daily.  He denies any seizure events.  Reports that he tolerates the medication well although it is expensive.  He operates a Teacher, music without difficulty.  Denies any changes with his gait or balance.  He returns today for an evaluation.  04/29/18:   Jay Hall is a 46 year old male with a history of seizures.  He returns today for follow-up.  He remains on Dilantin 350 mg daily.  He denies any seizure events.  Continue to operate a motor vehicle.  He is able to complete all ADLs independently.  His last bone density scan was in 2018 which was unremarkable.  Reports that he takes vitamin D off and on.  He returns today for evaluation.   REVIEW OF SYSTEMS: Out of a complete 14 system review of symptoms, the patient complains only of the following symptoms, and all other reviewed systems are negative.  See HPI  ALLERGIES: No Known  Allergies  HOME MEDICATIONS: Outpatient Medications Prior to Visit  Medication Sig Dispense Refill   cholecalciferol (VITAMIN D3) 25 MCG (1000 UNIT) tablet Take 1 tablet (1,000 Units total) by mouth daily. 30 tablet 11   ciclopirox (PENLAC) 8 % solution Apply topically.     DILANTIN 100 MG ER capsule TAKE 3 CAPSULES DAILY 270 capsule 3   DILANTIN INFATABS 50 MG tablet CHEW 1 TABLET DAILY.  DAW-1 90 tablet 3   Fluocinonide 0.1 % CREA APPLY TO AFFECTED AREA TWICE A DAY UNTIL HEALED  2   No facility-administered medications prior to visit.    PAST MEDICAL HISTORY: Past Medical History:  Diagnosis Date   Epilepsy ([place]) 1998   Dr. Cloretta Ned    PAST SURGICAL HISTORY: Past Surgical History:  Procedure Laterality Date   NO PAST SURGERIES      FAMILY HISTORY: Family History  Problem Relation Age of Onset   Hypertension Mother    Cancer Father        Prostate   Hypertension Father    Seizures Neg Hx     SOCIAL HISTORY: Social History   Socioeconomic History   Marital status: Married    Spouse name: [name]   Number of children: 2   Years of education: BA   Highest education level: Not on file  Occupational History   Occupation: Surveyor, minerals support    Employer: [employer]  Tobacco Use   Smoking status: Never   Smokeless tobacco: Never  Substance and Sexual Activity   Alcohol use: Yes   Drug use: No   Sexual activity: Yes  Other Topics Concern   Not on file  Social History Narrative   Regular exercise-no   Patient lives at home with with [name].    Patient has 2 children.    Patient has a BA degree.    Social Determinants of Health   Financial Resource Strain: Not on file  Food Insecurity: Not on file  Transportation Needs: Not on file  Physical Activity: Not on file  Stress: Not on file  Social Connections: Not on file  Intimate Partner Violence: Not on file      PHYSICAL EXAM  Vitals:   07/26/21 0807  BP: 129/88  Pulse: 85     There is no height or weight on file to calculate BMI.  Generalized: Well developed, in no acute distress   Neurological examination  Mentation: Alert oriented to time, place, history taking. Follows all commands speech and language fluent Cranial nerve II-XII: Pupils were equal round reactive to light. Extraocular movements were full, visual field were full on confrontational test.  Head turning and shoulder shrug  were normal and symmetric. Motor: The motor testing reveals 5 over 5 strength of all 4 extremities. Good symmetric motor tone is noted throughout.  Sensory: Sensory testing is intact to soft touch on all 4 extremities. No evidence of extinction is noted.  Coordination: Cerebellar testing reveals good finger-nose-finger and heel-to-shin bilaterally.  Gait and station: Gait is normal. Tandem gait is normal. Romberg is negative. No drift is seen.  Reflexes: Deep tendon reflexes are symmetric and normal bilaterally.   DIAGNOSTIC DATA (LABS, IMAGING, TESTING) - I reviewed patient records, labs, notes, testing and imaging myself where available.  Lab Results  Component Value Date   WBC 3.1 (L) 07/26/2020   HGB 14.7 07/26/2020   HCT 43.9 07/26/2020   MCV 94 07/26/2020   PLT 179 07/26/2020      Component Value Date/Time   NA 140 08/24/2020 0905   NA 143 07/26/2020 0858   K 4.6 08/24/2020 0905   CL 103 08/24/2020 0905   CO2 33 (H) 08/24/2020 0905   GLUCOSE 102 (H) 08/24/2020 0905   BUN 21 08/24/2020 0905   BUN 20 07/26/2020 0858   CREATININE 0.94 08/24/2020 0905   CALCIUM 9.8 08/24/2020 0905   PROT 7.0 08/24/2020 0905   PROT 6.4 07/26/2020 0858   ALBUMIN 4.7 08/24/2020 0905   ALBUMIN 4.6 07/26/2020 0858   AST 18 08/24/2020 0905   ALT 20 08/24/2020 0905   ALKPHOS 52 08/24/2020 0905   BILITOT 0.5 08/24/2020 0905   BILITOT <0.2 07/26/2020 0858   GFRNONAA 99 07/26/2020 0858   GFRAA 115 07/26/2020 0858   Lab Results  Component Value Date   CHOL 150 08/24/2020    HDL 48.50 08/24/2020   LDLCALC 80 08/24/2020   TRIG 109.0 08/24/2020   CHOLHDL 3 08/24/2020   Lab Results  Component Value Date   HGBA1C 5.4 08/24/2020   Lab Results  Component Value Date   VITAMINB12 241 02/15/2010   Lab Results  Component Value Date   TSH 1.16 08/24/2020      ASSESSMENT AND PLAN 46 y.o. year old male  has a past medical history of Epilepsy ([place]) (1998). here with :  1.  Seizures  -Continue Dilantin 350 mg daily -Blood work today -Advised if he has  any seizure events he should let us know. -Discussed other medication options such as Keppra.  Did advise that if we wean him off of Dilantin he would have to refrain from driving due to risk of breakthrough seizure.  Patient will look over medication and let us know if he wants to make any adjustments. -Follow-up in 1 year or sooner if needed  Patient was previously established with Dr. Jannifer Franklin as his primary neurologist.  He will be transitioned to Dr. April Manson.    Ward Givens, MSN, NP-C 07/26/2021, 8:03 AM [facility] [address] [place], [place] [address] [phone]

## 2021-07-26 NOTE — Patient Instructions (Signed)
Your Plan:  Continue Dilantin  Keppra is another option for the future If your symptoms worsen or you develop new symptoms please let us know.   Thank you for coming to see [place] at [facility]. I hope we have been able to provide you high quality care today.  You may receive a patient satisfaction survey over the next few weeks. We would appreciate your feedback and comments so that we may continue to improve ourselves and the health of our patients.

## 2021-07-27 LAB — COMPREHENSIVE METABOLIC PANEL
ALT: 20 IU/L (ref 0–44)
AST: 19 IU/L (ref 0–40)
Albumin/Globulin Ratio: 2.4 — ABNORMAL HIGH (ref 1.2–2.2)
Albumin: 4.8 g/dL (ref 4.0–5.0)
Alkaline Phosphatase: 64 IU/L (ref 44–121)
BUN/Creatinine Ratio: 16 (ref 9–20)
BUN: 16 mg/dL (ref 6–24)
Bilirubin Total: 0.3 mg/dL (ref 0.0–1.2)
CO2: 25 mmol/L (ref 20–29)
Calcium: 9.4 mg/dL (ref 8.7–10.2)
Chloride: 102 mmol/L (ref 96–106)
Creatinine, Ser: 0.98 mg/dL (ref 0.76–1.27)
Globulin, Total: 2 g/dL (ref 1.5–4.5)
Glucose: 95 mg/dL (ref 70–99)
Potassium: 4.7 mmol/L (ref 3.5–5.2)
Sodium: 141 mmol/L (ref 134–144)
Total Protein: 6.8 g/dL (ref 6.0–8.5)
eGFR: 97 mL/min/{1.73_m2} (ref >59)

## 2021-07-27 LAB — CBC WITH DIFFERENTIAL/PLATELET
Basophils Absolute: 0 10*3/uL (ref 0.0–0.2)
Basos: 0 %
EOS (ABSOLUTE): 0.1 10*3/uL (ref 0.0–0.4)
Eos: 4 %
Hematocrit: 45.7 % (ref 37.5–51.0)
Hemoglobin: 15.3 g/dL (ref 13.0–17.7)
Immature Grans (Abs): 0 10*3/uL (ref 0.0–0.1)
Immature Granulocytes: 0 %
Lymphocytes Absolute: 1.4 10*3/uL (ref 0.7–3.1)
Lymphs: 39 %
MCH: 31.5 pg (ref 26.6–33.0)
MCHC: 33.5 g/dL (ref 31.5–35.7)
MCV: 94 fL (ref 79–97)
Monocytes Absolute: 0.3 10*3/uL (ref 0.1–0.9)
Monocytes: 8 %
Neutrophils Absolute: 1.7 10*3/uL (ref 1.4–7.0)
Neutrophils: 49 %
Platelets: 211 10*3/uL (ref 150–450)
RBC: 4.86 x10E6/uL (ref 4.14–5.80)
RDW: 12.7 % (ref 11.6–15.4)
WBC: 3.5 10*3/uL (ref 3.4–10.8)

## 2021-07-27 LAB — PHENYTOIN LEVEL, TOTAL: Phenytoin (Dilantin), Serum: 10.5 ug/mL (ref 10.0–20.0)

## 2021-08-24 DIAGNOSIS — M9903 Segmental and somatic dysfunction of lumbar region: Secondary | ICD-10-CM | POA: Diagnosis not present

## 2021-08-24 DIAGNOSIS — M9902 Segmental and somatic dysfunction of thoracic region: Secondary | ICD-10-CM | POA: Diagnosis not present

## 2021-08-24 DIAGNOSIS — M9901 Segmental and somatic dysfunction of cervical region: Secondary | ICD-10-CM | POA: Diagnosis not present

## 2021-08-24 DIAGNOSIS — M531 Cervicobrachial syndrome: Secondary | ICD-10-CM | POA: Diagnosis not present

## 2021-09-19 ENCOUNTER — Other Ambulatory Visit: Payer: Self-pay | Admitting: Adult Health

## 2021-10-11 DIAGNOSIS — M9903 Segmental and somatic dysfunction of lumbar region: Secondary | ICD-10-CM | POA: Diagnosis not present

## 2021-10-11 DIAGNOSIS — M9902 Segmental and somatic dysfunction of thoracic region: Secondary | ICD-10-CM | POA: Diagnosis not present

## 2021-10-11 DIAGNOSIS — M9901 Segmental and somatic dysfunction of cervical region: Secondary | ICD-10-CM | POA: Diagnosis not present

## 2021-10-11 DIAGNOSIS — M531 Cervicobrachial syndrome: Secondary | ICD-10-CM | POA: Diagnosis not present

## 2021-11-15 DIAGNOSIS — M9903 Segmental and somatic dysfunction of lumbar region: Secondary | ICD-10-CM | POA: Diagnosis not present

## 2021-11-15 DIAGNOSIS — M531 Cervicobrachial syndrome: Secondary | ICD-10-CM | POA: Diagnosis not present

## 2021-11-15 DIAGNOSIS — M9901 Segmental and somatic dysfunction of cervical region: Secondary | ICD-10-CM | POA: Diagnosis not present

## 2021-11-15 DIAGNOSIS — M9902 Segmental and somatic dysfunction of thoracic region: Secondary | ICD-10-CM | POA: Diagnosis not present

## 2022-01-04 DIAGNOSIS — M9902 Segmental and somatic dysfunction of thoracic region: Secondary | ICD-10-CM | POA: Diagnosis not present

## 2022-01-04 DIAGNOSIS — M9903 Segmental and somatic dysfunction of lumbar region: Secondary | ICD-10-CM | POA: Diagnosis not present

## 2022-01-04 DIAGNOSIS — M9901 Segmental and somatic dysfunction of cervical region: Secondary | ICD-10-CM | POA: Diagnosis not present

## 2022-01-04 DIAGNOSIS — M531 Cervicobrachial syndrome: Secondary | ICD-10-CM | POA: Diagnosis not present

## 2022-02-23 DIAGNOSIS — M531 Cervicobrachial syndrome: Secondary | ICD-10-CM | POA: Diagnosis not present

## 2022-02-23 DIAGNOSIS — M9901 Segmental and somatic dysfunction of cervical region: Secondary | ICD-10-CM | POA: Diagnosis not present

## 2022-02-23 DIAGNOSIS — M9902 Segmental and somatic dysfunction of thoracic region: Secondary | ICD-10-CM | POA: Diagnosis not present

## 2022-02-23 DIAGNOSIS — M9903 Segmental and somatic dysfunction of lumbar region: Secondary | ICD-10-CM | POA: Diagnosis not present

## 2022-03-21 DIAGNOSIS — L718 Other rosacea: Secondary | ICD-10-CM | POA: Diagnosis not present

## 2022-03-23 DIAGNOSIS — M9903 Segmental and somatic dysfunction of lumbar region: Secondary | ICD-10-CM | POA: Diagnosis not present

## 2022-03-23 DIAGNOSIS — M9901 Segmental and somatic dysfunction of cervical region: Secondary | ICD-10-CM | POA: Diagnosis not present

## 2022-03-23 DIAGNOSIS — M531 Cervicobrachial syndrome: Secondary | ICD-10-CM | POA: Diagnosis not present

## 2022-03-23 DIAGNOSIS — M9902 Segmental and somatic dysfunction of thoracic region: Secondary | ICD-10-CM | POA: Diagnosis not present

## 2022-05-04 DIAGNOSIS — M9902 Segmental and somatic dysfunction of thoracic region: Secondary | ICD-10-CM | POA: Diagnosis not present

## 2022-05-04 DIAGNOSIS — M9901 Segmental and somatic dysfunction of cervical region: Secondary | ICD-10-CM | POA: Diagnosis not present

## 2022-05-04 DIAGNOSIS — M531 Cervicobrachial syndrome: Secondary | ICD-10-CM | POA: Diagnosis not present

## 2022-05-04 DIAGNOSIS — M9903 Segmental and somatic dysfunction of lumbar region: Secondary | ICD-10-CM | POA: Diagnosis not present

## 2022-06-14 DIAGNOSIS — M9901 Segmental and somatic dysfunction of cervical region: Secondary | ICD-10-CM | POA: Diagnosis not present

## 2022-06-14 DIAGNOSIS — M9902 Segmental and somatic dysfunction of thoracic region: Secondary | ICD-10-CM | POA: Diagnosis not present

## 2022-06-14 DIAGNOSIS — M9903 Segmental and somatic dysfunction of lumbar region: Secondary | ICD-10-CM | POA: Diagnosis not present

## 2022-06-14 DIAGNOSIS — M531 Cervicobrachial syndrome: Secondary | ICD-10-CM | POA: Diagnosis not present

## 2022-06-15 ENCOUNTER — Other Ambulatory Visit: Payer: Self-pay | Admitting: Adult Health

## 2022-07-18 DIAGNOSIS — M9903 Segmental and somatic dysfunction of lumbar region: Secondary | ICD-10-CM | POA: Diagnosis not present

## 2022-07-18 DIAGNOSIS — M9902 Segmental and somatic dysfunction of thoracic region: Secondary | ICD-10-CM | POA: Diagnosis not present

## 2022-07-18 DIAGNOSIS — M9901 Segmental and somatic dysfunction of cervical region: Secondary | ICD-10-CM | POA: Diagnosis not present

## 2022-07-18 DIAGNOSIS — M531 Cervicobrachial syndrome: Secondary | ICD-10-CM | POA: Diagnosis not present

## 2022-07-26 ENCOUNTER — Ambulatory Visit: Payer: BC Managed Care – PPO | Admitting: Neurology

## 2022-08-03 ENCOUNTER — Encounter: Payer: Self-pay | Admitting: Internal Medicine

## 2022-08-03 ENCOUNTER — Ambulatory Visit (INDEPENDENT_AMBULATORY_CARE_PROVIDER_SITE_OTHER): Payer: BC Managed Care – PPO | Admitting: Internal Medicine

## 2022-08-03 VITALS — BP 110/78 | HR 80 | Temp 98.0°F | Ht 72.0 in | Wt 188.0 lb

## 2022-08-03 DIAGNOSIS — L719 Rosacea, unspecified: Secondary | ICD-10-CM | POA: Diagnosis not present

## 2022-08-03 DIAGNOSIS — B351 Tinea unguium: Secondary | ICD-10-CM | POA: Diagnosis not present

## 2022-08-03 DIAGNOSIS — H6123 Impacted cerumen, bilateral: Secondary | ICD-10-CM | POA: Diagnosis not present

## 2022-08-03 DIAGNOSIS — Z1211 Encounter for screening for malignant neoplasm of colon: Secondary | ICD-10-CM | POA: Diagnosis not present

## 2022-08-03 DIAGNOSIS — Z125 Encounter for screening for malignant neoplasm of prostate: Secondary | ICD-10-CM

## 2022-08-03 DIAGNOSIS — G4725 Circadian rhythm sleep disorder, jet lag type: Secondary | ICD-10-CM | POA: Diagnosis not present

## 2022-08-03 DIAGNOSIS — H6120 Impacted cerumen, unspecified ear: Secondary | ICD-10-CM | POA: Insufficient documentation

## 2022-08-03 DIAGNOSIS — Z0000 Encounter for general adult medical examination without abnormal findings: Secondary | ICD-10-CM | POA: Diagnosis not present

## 2022-08-03 LAB — COMPREHENSIVE METABOLIC PANEL
ALT: 18 U/L (ref 0–53)
AST: 17 U/L (ref 0–37)
Albumin: 4.5 g/dL (ref 3.5–5.2)
Alkaline Phosphatase: 55 U/L (ref 39–117)
BUN: 17 mg/dL (ref 6–23)
CO2: 29 mEq/L (ref 19–32)
Calcium: 9.5 mg/dL (ref 8.4–10.5)
Chloride: 103 mEq/L (ref 96–112)
Creatinine, Ser: 1.05 mg/dL (ref 0.40–1.50)
GFR: 85.11 mL/min (ref >60.00)
Glucose, Bld: 92 mg/dL (ref 70–99)
Potassium: 4.9 mEq/L (ref 3.5–5.1)
Sodium: 140 mEq/L (ref 135–145)
Total Bilirubin: 0.4 mg/dL (ref 0.2–1.2)
Total Protein: 6.8 g/dL (ref 6.0–8.3)

## 2022-08-03 LAB — PSA: PSA: 1.13 ng/mL (ref 0.10–4.00)

## 2022-08-03 LAB — CBC WITH DIFFERENTIAL/PLATELET
Basophils Absolute: 0 10*3/uL (ref 0.0–0.1)
Basophils Relative: 0.4 % (ref 0.0–3.0)
Eosinophils Absolute: 0.1 10*3/uL (ref 0.0–0.7)
Eosinophils Relative: 3 % (ref 0.0–5.0)
HCT: 45.5 % (ref 39.0–52.0)
Hemoglobin: 15.4 g/dL (ref 13.0–17.0)
Lymphocytes Relative: 42.8 % (ref 12.0–46.0)
Lymphs Abs: 1.5 10*3/uL (ref 0.7–4.0)
MCHC: 33.8 g/dL (ref 30.0–36.0)
MCV: 95.1 fl (ref 78.0–100.0)
Monocytes Absolute: 0.3 10*3/uL (ref 0.1–1.0)
Monocytes Relative: 8.4 % (ref 3.0–12.0)
Neutro Abs: 1.6 10*3/uL (ref 1.4–7.7)
Neutrophils Relative %: 45.4 % (ref 43.0–77.0)
Platelets: 212 10*3/uL (ref 150.0–400.0)
RBC: 4.79 Mil/uL (ref 4.22–5.81)
RDW: 13.7 % (ref 11.5–15.5)
WBC: 3.5 10*3/uL — ABNORMAL LOW (ref 4.0–10.5)

## 2022-08-03 LAB — LIPID PANEL
Cholesterol: 164 mg/dL (ref 0–200)
HDL: 52.1 mg/dL (ref >39.00)
LDL Cholesterol: 95 mg/dL (ref 0–99)
NonHDL: 111.47
Total CHOL/HDL Ratio: 3
Triglycerides: 82 mg/dL (ref 0.0–149.0)
VLDL: 16.4 mg/dL (ref 0.0–40.0)

## 2022-08-03 LAB — URINALYSIS
Bilirubin Urine: NEGATIVE
Hgb urine dipstick: NEGATIVE
Ketones, ur: NEGATIVE
Leukocytes,Ua: NEGATIVE
Nitrite: NEGATIVE
Specific Gravity, Urine: 1.015 (ref 1.000–1.030)
Total Protein, Urine: NEGATIVE
Urine Glucose: NEGATIVE
Urobilinogen, UA: 0.2 (ref 0.0–1.0)
pH: 7.5 (ref 5.0–8.0)

## 2022-08-03 LAB — TSH: TSH: 1.14 u[IU]/mL (ref 0.35–5.50)

## 2022-08-03 MED ORDER — ZOLPIDEM TARTRATE 10 MG PO TABS: 10.0000 mg | ORAL_TABLET | Freq: Every evening | ORAL | 1 refills | Status: AC | PRN

## 2022-08-03 MED ORDER — DOXYCYCLINE HYCLATE 100 MG PO TABS: 100.0000 mg | ORAL_TABLET | Freq: Two times a day (BID) | ORAL | 3 refills | Status: DC

## 2022-08-03 MED ORDER — CICLOPIROX 8 % EX SOLN: Freq: Every day | CUTANEOUS | 1 refills | Status: AC

## 2022-08-03 NOTE — Assessment & Plan Note (Signed)
Zolpidem prn  Potential benefits of a short term benzodiazepines  use as well as potential risks  and complications were explained to the patient and were aknowledged.

## 2022-08-03 NOTE — Assessment & Plan Note (Signed)
Worse Cont w/abx cream Start PO Doxy

## 2022-08-03 NOTE — Assessment & Plan Note (Signed)
Re-start Penlac

## 2022-08-03 NOTE — Progress Notes (Signed)
Subjective:  Patient ID: Jay Hall, male    DOB: Nov 05, 1975  Age: 47 y.o. MRN: [id number]  CC: Annual Exam   HPI Jay Hall presents for a well exam  C/o rosacea - worse C/o clogged R ear C/o nail fungus - not better C/o insomnia/jet lag hen traveling  Outpatient Medications Prior to Visit  Medication Sig Dispense Refill   CALCIUM PO Take by mouth daily.     DILANTIN 100 MG ER capsule TAKE 3 CAPSULES DAILY 270 capsule 3   DILANTIN INFATABS 50 MG tablet CHEW 1 TABLET DAILY 90 tablet 3   ciclopirox (PENLAC) 8 % solution Apply topically.     doxycycline (ADOXA) 100 MG tablet Take 100 mg by mouth 2 (two) times daily.     Fluocinonide 0.1 % CREA APPLY TO AFFECTED AREA TWICE A DAY UNTIL HEALED  2   No facility-administered medications prior to visit.    ROS: Review of Systems  Constitutional:  Negative for appetite change, fatigue and unexpected weight change.  HENT:  Negative for congestion, nosebleeds, sneezing, sore throat and trouble swallowing.   Eyes:  Negative for itching and visual disturbance.  Respiratory:  Negative for cough.   Cardiovascular:  Negative for chest pain, palpitations and leg swelling.  Gastrointestinal:  Negative for abdominal distention, blood in stool, diarrhea and nausea.  Genitourinary:  Negative for frequency and hematuria.  Musculoskeletal:  Negative for back pain, gait problem, joint swelling and neck pain.  Skin:  Negative for rash.  Neurological:  Negative for dizziness, tremors, speech difficulty and weakness.  Psychiatric/Behavioral:  Negative for agitation, dysphoric mood and sleep disturbance. The patient is not nervous/anxious.     Objective:  BP 110/78 (BP Location: Left Arm, Patient Position: Sitting, Cuff Size: Large)   Pulse 80   Temp 98 F (36.7 C) (Oral)   Ht 6' (1.829 m)   Wt 188 lb (85.3 kg)   SpO2 98%   BMI 25.50 kg/m   BP Readings from Last 3 Encounters:  08/03/22 110/78  07/26/21 129/88  08/24/20  120/82    Wt Readings from Last 3 Encounters:  08/03/22 188 lb (85.3 kg)  08/24/20 178 lb 12.8 oz (81.1 kg)  07/26/20 181 lb (82.1 kg)    Physical Exam Constitutional:      General: He is not in acute distress.    Appearance: He is well-developed.     Comments: NAD  Eyes:     Conjunctiva/sclera: Conjunctivae normal.     Pupils: Pupils are equal, round, and reactive to light.  Neck:     Thyroid: No thyromegaly.     Vascular: No JVD.  Cardiovascular:     Rate and Rhythm: Normal rate and regular rhythm.     Heart sounds: Normal heart sounds. No murmur heard.    No friction rub. No gallop.  Pulmonary:     Effort: Pulmonary effort is normal. No respiratory distress.     Breath sounds: Normal breath sounds. No wheezing or rales.  Chest:     Chest wall: No tenderness.  Abdominal:     General: Bowel sounds are normal. There is no distension.     Palpations: Abdomen is soft. There is no mass.     Tenderness: There is no abdominal tenderness. There is no guarding or rebound.  Musculoskeletal:        General: No tenderness. Normal range of motion.     Cervical back: Normal range of motion.  Lymphadenopathy:  Cervical: No cervical adenopathy.  Skin:    General: Skin is warm and dry.     Findings: No rash.  Neurological:     Mental Status: He is alert and oriented to person, place, and time.     Cranial Nerves: No cranial nerve deficit.     Motor: No abnormal muscle tone.     Coordination: Coordination normal.     Gait: Gait normal.     Deep Tendon Reflexes: Reflexes are normal and symmetric.  Psychiatric:        Behavior: Behavior normal.        Thought Content: Thought content normal.        Judgment: Judgment normal.   Wax R>>L ear Rosacea on face, nose L big toe w/fungus  I spent 22 minutes in addition to time for CPX wellness examination in preparing to see the patient by review of recent labs, imaging and procedures, obtaining and reviewing separately obtained  history, communicating with the patient, ordering medications, tests or procedures, and documenting clinical information in the EHR including the differential diagnosis, treatment, and any further evaluation and other management of onychomycosis, jet lag, rosacea         Lab Results  Component Value Date   WBC 3.5 (L) 08/03/2022   HGB 15.4 08/03/2022   HCT 45.5 08/03/2022   PLT 212.0 08/03/2022   GLUCOSE 92 08/03/2022   CHOL 164 08/03/2022   TRIG 82.0 08/03/2022   HDL 52.10 08/03/2022   LDLCALC 95 08/03/2022   ALT 18 08/03/2022   AST 17 08/03/2022   NA 140 08/03/2022   K 4.9 08/03/2022   CL 103 08/03/2022   CREATININE 1.05 08/03/2022   BUN 17 08/03/2022   CO2 29 08/03/2022   TSH 1.14 08/03/2022   PSA 1.13 08/03/2022   HGBA1C 5.4 08/24/2020   MICROALBUR 0.8 12/06/2010    DG BONE DENSITY (DXA)  Result Date: 05/09/2017 EXAM: DUAL X-RAY ABSORPTIOMETRY (DXA) FOR BONE MINERAL DENSITY IMPRESSION: Referring Physician: Ward Givens PATIENT: Name: Jay Hall Patient ID: [id number] Birth Date: Dec 12, 1975 Height: 73.0 in. Sex: Male Measured: 05/09/2017 Weight: 181.3 lbs. Indications: Caucasian, Dilantin, Estrogen Deficient Fractures: None Treatments: Multivitamin ASSESSMENT: The BMD measured at Femur Neck Right is 0.824 g/cm2 with a Z-score of -1.5. The Z-score is within the expected range for age. ISCD recommends using Z-scores for assessment of pre-menopausal women, men between 36 and 64 yoa, and children under 24 yoa. The diagnosis of osteoporosis in these patients should not be made on the basis of densitometric criteria alone. Site      Region     Measured Date Measured Age YA      AM      BMD T-score Z-score DualFemur Neck Right 05/09/2017 41.3 -1.5 -1.5 0.824 g/cm2 AP Spine L1-L4 05/09/2017 41.3 0.6 0.3 1.272 g/cm2 RECOMMENDATION: All patients should ensure an adequate intake of dietary calcium (1200 mg/d) and vitamin D (800 IU daily) unless contraindicated. FOLLOW-UP: As  clinically indicated. I have reviewed this report, and agree with the above findings. [facility] Electronically Signed   By: Kerby Moors M.D.   On: 05/09/2017 09:24    Assessment & Plan:   Problem List Items Addressed This Visit       Nervous and Auditory   Cerumen impaction    R>>L Irrigate at home or RTC        Musculoskeletal and Integument   Rosacea    Worse Cont w/abx cream Start PO Doxy  ONYCHOMYCOSIS    Re-start Penlac      Relevant Medications   ciclopirox (PENLAC) 8 % solution     Other   Well adult exam - Primary     We discussed age appropriate health related issues, including available/recomended screening tests and vaccinations. Labs were ordered to be later reviewed . All questions were answered. We discussed one or more of the following - seat belt use, use of sunscreen/sun exposure exercise, fall risk reduction, second hand smoke exposure, firearm use and storage, seat belt use, a need for adhering to healthy diet and exercise. Labs were ordered.  All questions were answered. GI ref was made      Relevant Orders   TSH (Completed)   Urinalysis (Completed)   CBC with Differential/Platelet (Completed)   Lipid panel (Completed)   PSA (Completed)   Comprehensive metabolic panel (Completed)   Jet lag    Zolpidem prn  Potential benefits of a short term benzodiazepines  use as well as potential risks  and complications were explained to the patient and were aknowledged.        Other Visit Diagnoses     Colon cancer screening       Relevant Orders   Ambulatory referral to Gastroenterology         Meds ordered this encounter  Medications   zolpidem (AMBIEN) 10 MG tablet    Sig: Take 1 tablet (10 mg total) by mouth at bedtime as needed for sleep.    Dispense:  30 tablet    Refill:  1   ciclopirox (PENLAC) 8 % solution    Sig: Apply topically daily.    Dispense:  6.6 mL    Refill:  1   doxycycline (VIBRA-TABS) 100 MG tablet     Sig: Take 1 tablet (100 mg total) by mouth 2 (two) times daily.    Dispense:  60 tablet    Refill:  3      Follow-up: Return in about 1 year (around 08/04/2023) for Wellness Exam.  Walker Kehr, MD

## 2022-08-03 NOTE — Assessment & Plan Note (Addendum)
R>>L Irrigate at home or RTC

## 2022-08-03 NOTE — Assessment & Plan Note (Signed)
Epilepsy - on Dilantin

## 2022-08-07 NOTE — Assessment & Plan Note (Signed)

## 2022-08-08 ENCOUNTER — Encounter: Payer: Self-pay | Admitting: Gastroenterology

## 2022-08-22 DIAGNOSIS — M9902 Segmental and somatic dysfunction of thoracic region: Secondary | ICD-10-CM | POA: Diagnosis not present

## 2022-08-22 DIAGNOSIS — M9903 Segmental and somatic dysfunction of lumbar region: Secondary | ICD-10-CM | POA: Diagnosis not present

## 2022-08-22 DIAGNOSIS — M531 Cervicobrachial syndrome: Secondary | ICD-10-CM | POA: Diagnosis not present

## 2022-08-22 DIAGNOSIS — M9901 Segmental and somatic dysfunction of cervical region: Secondary | ICD-10-CM | POA: Diagnosis not present

## 2022-09-08 ENCOUNTER — Ambulatory Visit (AMBULATORY_SURGERY_CENTER): Payer: BC Managed Care – PPO

## 2022-09-08 VITALS — Ht 72.0 in | Wt 183.0 lb

## 2022-09-08 DIAGNOSIS — Z1211 Encounter for screening for malignant neoplasm of colon: Secondary | ICD-10-CM

## 2022-09-08 MED ORDER — NA SULFATE-K SULFATE-MG SULF 17.5-3.13-1.6 GM/177ML PO SOLN: 1.0000 | Freq: Once | ORAL | 0 refills | Status: AC

## 2022-09-08 NOTE — Progress Notes (Signed)

## 2022-09-11 ENCOUNTER — Encounter: Payer: Self-pay | Admitting: Neurology

## 2022-09-11 ENCOUNTER — Ambulatory Visit: Payer: BC Managed Care – PPO | Admitting: Neurology

## 2022-09-11 ENCOUNTER — Encounter: Payer: Self-pay | Admitting: Gastroenterology

## 2022-09-11 VITALS — BP 134/87 | HR 84 | Ht 72.0 in | Wt 187.0 lb

## 2022-09-11 DIAGNOSIS — R569 Unspecified convulsions: Secondary | ICD-10-CM

## 2022-09-11 NOTE — Progress Notes (Signed)
PATIENT: Jay Hall DOB: 11-11-1975  REASON FOR VISIT: follow up HISTORY FROM: patient  HISTORY OF PRESENT ILLNESS: Today 09/11/22: Patient presents today, last visit was in January 2023.  At that time he reported not having any seizures.  He still on phenytoin 350 mg nightly.  He reported his last seizure was in 1998.  He was still in school.  Prior to his seizure he will have an aura of hearing a lot of chatter then will have a convulsion.  Again his last seizure was more than 25 years ago.  He is compliant with his Dilantin.  He would like to come off medication   Interval History 07/26/2021: Jay Hall is a 47 year old male with a history of seizures.  He returns today for follow-up.  He continues on Dilantin 3 and a 50 mg daily.  His last DEXA scan was in 2018.  He continues to take vitamin C and D supplements.  He denies any seizure events.  He is asking today about switching to another seizure medication.  He is concerned about potential long-term effects of Dilantin.  He is currently operating a motor vehicle.   07/26/20: Jay Hall is a 47 year old male with a history of seizures.  He returns today for follow-up.  Reports overall he continues to do very well.  He denies any seizure events.  Continues on Dilantin 350 mg daily.  Denies any changes with his gait or balance.  Continues to complete all ADLs independently.  Operates a Teacher, music.  07/24/19:  Jay Hall is a 47 year old male with a history of seizures.  He returns today for follow-up.  He continues on Dilantin 350 mg daily.  He denies any seizure events.  Reports that he tolerates the medication well although it is expensive.  He operates a Teacher, music without difficulty.  Denies any changes with his gait or balance.  He returns today for an evaluation.  04/29/18:   Jay Hall is a 47 year old male with a history of seizures.  He returns today for follow-up.  He remains on Dilantin 350 mg daily.   He denies any seizure events.  Continue to operate a motor vehicle.  He is able to complete all ADLs independently.  His last bone density scan was in 2018 which was unremarkable.  Reports that he takes vitamin D off and on.  He returns today for evaluation.   REVIEW OF SYSTEMS: Out of a complete 14 system review of symptoms, the patient complains only of the following symptoms, and all other reviewed systems are negative.  See HPI  ALLERGIES: No Known Allergies  HOME MEDICATIONS: Outpatient Medications Prior to Visit  Medication Sig Dispense Refill   CALCIUM PO Take by mouth daily.     cholecalciferol 25 MCG (1000 UT) tablet Take 1,000 Units by mouth daily.     ciclopirox (PENLAC) 8 % solution Apply topically daily. 6.6 mL 1   DILANTIN 100 MG ER capsule TAKE 3 CAPSULES DAILY 270 capsule 3   DILANTIN INFATABS 50 MG tablet CHEW 1 TABLET DAILY 90 tablet 3   doxycycline (VIBRA-TABS) 100 MG tablet Take 1 tablet (100 mg total) by mouth 2 (two) times daily. 60 tablet 3   zolpidem (AMBIEN) 10 MG tablet Take 1 tablet (10 mg total) by mouth at bedtime as needed for sleep. 30 tablet 1   No facility-administered medications prior to visit.    PAST MEDICAL HISTORY: Past Medical History:  Diagnosis Date   Epilepsy ([place])  1998   Dr. Cloretta Ned    PAST SURGICAL HISTORY: Past Surgical History:  Procedure Laterality Date   NO PAST SURGERIES      FAMILY HISTORY: Family History  Problem Relation Age of Onset   Hypertension Mother    Cancer Father        Prostate   Hypertension Father    Seizures Neg Hx    Colon cancer Neg Hx    Colon polyps Neg Hx    Esophageal cancer Neg Hx    Rectal cancer Neg Hx    Stomach cancer Neg Hx     SOCIAL HISTORY: Social History   Socioeconomic History   Marital status: Married    Spouse name: [name]   Number of children: 2   Years of education: BA   Highest education level: Not on file  Occupational History   Occupation: Surveyor, minerals  support    Employer: [employer]  Tobacco Use   Smoking status: Never   Smokeless tobacco: Never  Vaping Use   Vaping Use: Never used  Substance and Sexual Activity   Alcohol use: Yes    Alcohol/week: 2.0 standard drinks of alcohol    Types: 2 Cans of beer per week   Drug use: No   Sexual activity: Yes  Other Topics Concern   Not on file  Social History Narrative   Regular exercise-no   Patient lives at home with with [name].    Patient has 2 children.    Patient has a BA degree.    Social Determinants of Health   Financial Resource Strain: Not on file  Food Insecurity: Not on file  Transportation Needs: Not on file  Physical Activity: Not on file  Stress: Not on file  Social Connections: Not on file  Intimate Partner Violence: Not on file      PHYSICAL EXAM  Vitals:   09/11/22 1114  BP: 134/87  Pulse: 84  Weight: 187 lb (84.8 kg)  Height: 6' (1.829 m)    Body mass index is 25.36 kg/m.  Generalized: Well developed, in no acute distress   Neurological examination  Mentation: Alert oriented to time, place, history taking. Follows all commands speech and language fluent Cranial nerve II-XII: Pupils were equal round reactive to light. Extraocular movements were full, visual field were full on confrontational test.  Head turning and shoulder shrug  were normal and symmetric. Motor: The motor testing reveals 5 over 5 strength of all 4 extremities. Good symmetric motor tone is noted throughout.  Sensory: Sensory testing is intact to soft touch on all 4 extremities. No evidence of extinction is noted.  Coordination: Cerebellar testing reveals good finger-nose-finger and heel-to-shin bilaterally.  Gait and station: Gait is normal. Tandem gait is normal. Romberg is negative. No drift is seen.  Reflexes: Deep tendon reflexes are symmetric and normal bilaterally.   DIAGNOSTIC DATA (LABS, IMAGING, TESTING) - I reviewed patient records, labs, notes, testing and  imaging myself where available.  Lab Results  Component Value Date   WBC 3.5 (L) 08/03/2022   HGB 15.4 08/03/2022   HCT 45.5 08/03/2022   MCV 95.1 08/03/2022   PLT 212.0 08/03/2022      Component Value Date/Time   NA 140 08/03/2022 0912   NA 141 07/26/2021 0825   K 4.9 08/03/2022 0912   CL 103 08/03/2022 0912   CO2 29 08/03/2022 0912   GLUCOSE 92 08/03/2022 0912   BUN 17 08/03/2022 0912   BUN 16 07/26/2021  0825   CREATININE 1.05 08/03/2022 0912   CALCIUM 9.5 08/03/2022 0912   PROT 6.8 08/03/2022 0912   PROT 6.8 07/26/2021 0825   ALBUMIN 4.5 08/03/2022 0912   ALBUMIN 4.8 07/26/2021 0825   AST 17 08/03/2022 0912   ALT 18 08/03/2022 0912   ALKPHOS 55 08/03/2022 0912   BILITOT 0.4 08/03/2022 0912   BILITOT 0.3 07/26/2021 0825   GFRNONAA 99 07/26/2020 0858   GFRAA 115 07/26/2020 0858   Lab Results  Component Value Date   CHOL 164 08/03/2022   HDL 52.10 08/03/2022   LDLCALC 95 08/03/2022   TRIG 82.0 08/03/2022   CHOLHDL 3 08/03/2022   Lab Results  Component Value Date   HGBA1C 5.4 08/24/2020   Lab Results  Component Value Date   VITAMINB12 241 02/15/2010   Lab Results  Component Value Date   TSH 1.14 08/03/2022      ASSESSMENT AND PLAN 47 y.o. year old male  has a past medical history of Epilepsy ([place]) (1998). here with :  1.  Seizures  -Continue Dilantin 350 mg daily for now -Routine EEG, if normal then we will start the weaning process (likely decrease by 1 tablet nightly every 2 weeks) -Advised if he has any seizure events he should let us know. -Did advise that if we wean him off of Dilantin he would have to refrain from driving due to risk of breakthrough seizure.   -Once we start the process, I will also give him a prescription for a rescue medication.  -Follow-up in 1 year or sooner if needed    Alric Ran, MD 09/11/2022, 11:16 AM [facility] [address] [place], [place] [address] [phone]

## 2022-09-27 ENCOUNTER — Other Ambulatory Visit: Payer: Self-pay | Admitting: Adult Health

## 2022-09-27 ENCOUNTER — Ambulatory Visit: Payer: BC Managed Care – PPO | Admitting: Neurology

## 2022-09-27 DIAGNOSIS — R569 Unspecified convulsions: Secondary | ICD-10-CM | POA: Diagnosis not present

## 2022-09-27 MED ORDER — NAYZILAM 5 MG/0.1ML NA SOLN: 5.0000 mg | NASAL | 0 refills | Status: DC | PRN

## 2022-09-27 NOTE — Addendum Note (Signed)
Addended byAlric Ran on: 09/27/2022 04:52 PM   Modules accepted: Orders

## 2022-09-27 NOTE — Telephone Encounter (Signed)
Received dilantin request (saw Dr. April Manson last).   Looks like had EEG today.  May wean off medication over time.

## 2022-09-27 NOTE — Telephone Encounter (Signed)
His EEG was normal, we can send him #90 tablets and start the weaning process

## 2022-09-27 NOTE — Procedures (Signed)
    History:  47 year old man with seizure disorder   EEG classification: Awake and drowsy  Description of the recording: The background rhythms of this recording consists of a fairly well modulated medium amplitude alpha rhythm of 9 Hz that is reactive to eye opening and closure. Present in the anterior head region is a 15-20 Hz beta activity. Photic stimulation was performed, did not show any abnormalities. Hyperventilation was also performed, did not show any abnormalities. Drowsiness was manifested by background fragmentation. No abnormal epileptiform discharges seen during this recording. There was no focal slowing. There were no electrographic seizure identified.   Abnormality: None   Impression: This is a normal EEG recorded while drowsy and awake. No evidence of interictal epileptiform discharges. Normal EEGs, however, do not rule out epilepsy.    Alric Ran, MD [facility]

## 2022-09-28 ENCOUNTER — Encounter: Payer: Self-pay | Admitting: Neurology

## 2022-09-28 NOTE — Telephone Encounter (Signed)
Refill done for 90 days. Dilantin Infatabs.

## 2022-09-29 DIAGNOSIS — M9901 Segmental and somatic dysfunction of cervical region: Secondary | ICD-10-CM | POA: Diagnosis not present

## 2022-09-29 DIAGNOSIS — M9902 Segmental and somatic dysfunction of thoracic region: Secondary | ICD-10-CM | POA: Diagnosis not present

## 2022-09-29 DIAGNOSIS — M531 Cervicobrachial syndrome: Secondary | ICD-10-CM | POA: Diagnosis not present

## 2022-09-29 DIAGNOSIS — M9903 Segmental and somatic dysfunction of lumbar region: Secondary | ICD-10-CM | POA: Diagnosis not present

## 2022-10-02 NOTE — Telephone Encounter (Signed)
Called and scheduled pt. First available was 5/23. Pt has been added to the wait list.

## 2022-10-02 NOTE — Telephone Encounter (Signed)
It is best to schedule a video visit to go over all these questions. Please add patient to my cancellation list, hopefully I can see him prior to mid summer.

## 2022-10-09 ENCOUNTER — Encounter: Payer: BC Managed Care – PPO | Admitting: Gastroenterology

## 2022-10-12 ENCOUNTER — Encounter: Payer: BC Managed Care – PPO | Admitting: Gastroenterology

## 2022-10-19 DIAGNOSIS — L718 Other rosacea: Secondary | ICD-10-CM | POA: Diagnosis not present

## 2022-11-10 ENCOUNTER — Telehealth: Payer: Self-pay

## 2022-11-10 ENCOUNTER — Other Ambulatory Visit (HOSPITAL_COMMUNITY): Payer: Self-pay

## 2022-11-10 NOTE — Telephone Encounter (Signed)
Patient Advocate Encounter  Prior authorization for Nayzilam 5MG /0.1ML solution submitted and APPROVED through [employer].  Test billing returns $20.00 copay for 30 day supply. Quantity 2 per 30 days  Key [id number] Effective: 11-10-2022 - 11-10-2023'

## 2022-11-16 ENCOUNTER — Ambulatory Visit: Payer: BC Managed Care – PPO | Admitting: Neurology

## 2022-11-16 ENCOUNTER — Encounter: Payer: Self-pay | Admitting: Neurology

## 2022-11-16 VITALS — BP 125/90 | HR 75 | Ht 72.0 in | Wt 191.0 lb

## 2022-11-16 DIAGNOSIS — R569 Unspecified convulsions: Secondary | ICD-10-CM | POA: Diagnosis not present

## 2022-11-16 NOTE — Progress Notes (Signed)
PATIENT: Jay Hall DOB: [birth year]  REASON FOR VISIT: follow up HISTORY FROM: patient  HISTORY OF PRESENT ILLNESS: Today 11/16/22: Patient presented for follow-up, last visit was in March since then he has been doing well no seizure or seizure-like activity.  He would like to discuss coming off medication.  He is on Dilantin 350 nightly and his last seizure was more than 25 years ago.  He is agreeable to come off medication. His routine EEG, last month was normal.    INTERVAL HISTORY 09/11/2022 Patient presents today, last visit was in January 2023.  At that time he reported not having any seizures.  He still on phenytoin 350 mg nightly.  He reported his last seizure was in 1998.  He was still in school.  Prior to his seizure he will have an aura of hearing a lot of chatter then will have a convulsion.  Again his last seizure was more than 25 years ago.  He is compliant with his Dilantin.  He would like to come off medication   Interval History 07/26/2021: MM Mr. Kempel is a 47 year old male with a history of seizures.  He returns today for follow-up.  He continues on Dilantin 3 and a 50 mg daily.  His last DEXA scan was in 2018.  He continues to take vitamin C and D supplements.  He denies any seizure events.  He is asking today about switching to another seizure medication.  He is concerned about potential long-term effects of Dilantin.  He is currently operating a motor vehicle.   07/26/20: Mr. Dibartolo is a 47 year old male with a history of seizures.  He returns today for follow-up.  Reports overall he continues to do very well.  He denies any seizure events.  Continues on Dilantin 350 mg daily.  Denies any changes with his gait or balance.  Continues to complete all ADLs independently.  Operates a Librarian, academic.  07/24/19:  Mr. Decardenas is a 47 year old male with a history of seizures.  He returns today for follow-up.  He continues on Dilantin 350 mg daily.  He denies any  seizure events.  Reports that he tolerates the medication well although it is expensive.  He operates a Librarian, academic without difficulty.  Denies any changes with his gait or balance.  He returns today for an evaluation.  04/29/18:   Mr. Walenta is a 47 year old male with a history of seizures.  He returns today for follow-up.  He remains on Dilantin 350 mg daily.  He denies any seizure events.  Continue to operate a motor vehicle.  He is able to complete all ADLs independently.  His last bone density scan was in 2018 which was unremarkable.  Reports that he takes vitamin D off and on.  He returns today for evaluation.   REVIEW OF SYSTEMS: Out of a complete 14 system review of symptoms, the patient complains only of the following symptoms, and all other reviewed systems are negative.  See HPI  ALLERGIES: No Known Allergies  HOME MEDICATIONS: Outpatient Medications Prior to Visit  Medication Sig Dispense Refill   CALCIUM PO Take by mouth daily.     cholecalciferol 25 MCG (1000 UT) tablet Take 1,000 Units by mouth daily.     ciclopirox (PENLAC) 8 % solution Apply topically daily. 6.6 mL 1   DILANTIN 100 MG ER capsule TAKE 3 CAPSULES DAILY 270 capsule 3   DILANTIN INFATABS 50 MG tablet CHEW 1 TABLET DAILY 90 tablet 0  doxycycline (VIBRA-TABS) 100 MG tablet Take 1 tablet (100 mg total) by mouth 2 (two) times daily. 60 tablet 3   Midazolam (NAYZILAM) 5 MG/0.1ML SOLN Place 5 mg into the nose as needed (For seizure lasting more than 2 minutes). 2 each 0   zolpidem (AMBIEN) 10 MG tablet Take 1 tablet (10 mg total) by mouth at bedtime as needed for sleep. 30 tablet 1   No facility-administered medications prior to visit.    PAST MEDICAL HISTORY: Past Medical History:  Diagnosis Date   Epilepsy ([facility]) 1998   Dr. Blane Ohara    PAST SURGICAL HISTORY: Past Surgical History:  Procedure Laterality Date   NO PAST SURGERIES      FAMILY HISTORY: Family History  Problem Relation Age of  Onset   Hypertension Mother    Cancer Father        Prostate   Hypertension Father    Seizures Neg Hx    Colon cancer Neg Hx    Colon polyps Neg Hx    Esophageal cancer Neg Hx    Rectal cancer Neg Hx    Stomach cancer Neg Hx     SOCIAL HISTORY: Social History   Socioeconomic History   Marital status: Married    Spouse name: [name]   Number of children: 2   Years of education: BA   Highest education level: Not on file  Occupational History   Occupation: Nurse, adult support    Employer: [employer]  Tobacco Use   Smoking status: Never   Smokeless tobacco: Never  Vaping Use   Vaping Use: Never used  Substance and Sexual Activity   Alcohol use: Yes    Alcohol/week: 2.0 standard drinks of alcohol    Types: 2 Cans of beer per week   Drug use: No   Sexual activity: Yes    Birth control/protection: Condom  Other Topics Concern   Not on file  Social History Narrative   Regular exercise-no   Patient lives at home with with [place].    Patient has 2 children.    Patient has a BA degree.    Social Determinants of Health   Financial Resource Strain: Not on file  Food Insecurity: Not on file  Transportation Needs: Not on file  Physical Activity: Not on file  Stress: Not on file  Social Connections: Not on file  Intimate Partner Violence: Not on file      PHYSICAL EXAM  Vitals:   11/16/22 0806  BP: (!) 125/90  Pulse: 75  Weight: 191 lb (86.6 kg)  Height: 6' (1.829 m)    Body mass index is 25.9 kg/m.  Generalized: Well developed, in no acute distress   Neurological examination  Mentation: Alert oriented to time, place, history taking. Follows all commands speech and language fluent Cranial nerve II-XII: Pupils were equal round reactive to light. Extraocular movements were full, visual field were full on confrontational test.  Head turning and shoulder shrug  were normal and symmetric. Motor: The motor testing reveals 5 over 5 strength of  all 4 extremities. Good symmetric motor tone is noted throughout.  Sensory: Sensory testing is intact to soft touch on all 4 extremities. No evidence of extinction is noted.  Coordination: Cerebellar testing reveals good finger-nose-finger and heel-to-shin bilaterally.  Gait and station: Gait is normal. Tandem gait is normal. Romberg is negative. No drift is seen.  Reflexes: Deep tendon reflexes are symmetric and normal bilaterally.   DIAGNOSTIC DATA (LABS, IMAGING, TESTING) -  I reviewed patient records, labs, notes, testing and imaging myself where available.  Lab Results  Component Value Date   WBC 3.5 (L) 08/03/2022   HGB 15.4 08/03/2022   HCT 45.5 08/03/2022   MCV 95.1 08/03/2022   PLT 212.0 08/03/2022      Component Value Date/Time   NA 140 08/03/2022 0912   NA 141 07/26/2021 0825   K 4.9 08/03/2022 0912   CL 103 08/03/2022 0912   CO2 29 08/03/2022 0912   GLUCOSE 92 08/03/2022 0912   BUN 17 08/03/2022 0912   BUN 16 07/26/2021 0825   CREATININE 1.05 08/03/2022 0912   CALCIUM 9.5 08/03/2022 0912   PROT 6.8 08/03/2022 0912   PROT 6.8 07/26/2021 0825   ALBUMIN 4.5 08/03/2022 0912   ALBUMIN 4.8 07/26/2021 0825   AST 17 08/03/2022 0912   ALT 18 08/03/2022 0912   ALKPHOS 55 08/03/2022 0912   BILITOT 0.4 08/03/2022 0912   BILITOT 0.3 07/26/2021 0825   GFRNONAA 99 07/26/2020 0858   GFRAA 115 07/26/2020 0858   Lab Results  Component Value Date   CHOL 164 08/03/2022   HDL 52.10 08/03/2022   LDLCALC 95 08/03/2022   TRIG 82.0 08/03/2022   CHOLHDL 3 08/03/2022   Lab Results  Component Value Date   HGBA1C 5.4 08/24/2020   Lab Results  Component Value Date   VITAMINB12 241 02/15/2010   Lab Results  Component Value Date   TSH 1.14 08/03/2022   Routine EEG 09/27/22: Normal    ASSESSMENT AND PLAN 47 y.o. year old male  has a past medical history of Epilepsy ([facility]) (1998). here with :  1.  Seizures  -Continue Dilantin 350 mg daily for now -EEG was normal. When  ready, please start decrease Dilantin by 1 tablet nightly every 2 weeks and stop the medication after 8 weeks.  -Advised if he has any seizure events he should let us know. -Did advise that if we wean him off of Dilantin he would have to refrain from driving due to risk of breakthrough seizure.   -Nayzilam as rescue medication -Follow-up in 1 year or sooner if needed    Windell Norfolk, MD 11/16/2022, 8:23 AM [facility] [address] [place], [place] [address] [phone]

## 2022-11-17 ENCOUNTER — Encounter: Payer: Self-pay | Admitting: Gastroenterology

## 2022-11-17 ENCOUNTER — Ambulatory Visit (AMBULATORY_SURGERY_CENTER): Payer: BC Managed Care – PPO | Admitting: Gastroenterology

## 2022-11-17 VITALS — BP 120/88 | HR 72 | Temp 97.8°F | Resp 14 | Ht 72.0 in | Wt 183.2 lb

## 2022-11-17 DIAGNOSIS — D128 Benign neoplasm of rectum: Secondary | ICD-10-CM

## 2022-11-17 DIAGNOSIS — D124 Benign neoplasm of descending colon: Secondary | ICD-10-CM

## 2022-11-17 DIAGNOSIS — K635 Polyp of colon: Secondary | ICD-10-CM | POA: Diagnosis not present

## 2022-11-17 DIAGNOSIS — Z1211 Encounter for screening for malignant neoplasm of colon: Secondary | ICD-10-CM | POA: Diagnosis not present

## 2022-11-17 DIAGNOSIS — D122 Benign neoplasm of ascending colon: Secondary | ICD-10-CM

## 2022-11-17 DIAGNOSIS — K621 Rectal polyp: Secondary | ICD-10-CM | POA: Diagnosis not present

## 2022-11-17 DIAGNOSIS — K6289 Other specified diseases of anus and rectum: Secondary | ICD-10-CM | POA: Diagnosis not present

## 2022-11-17 DIAGNOSIS — K640 First degree hemorrhoids: Secondary | ICD-10-CM

## 2022-11-17 MED ORDER — SODIUM CHLORIDE 0.9 % IV SOLN
500.0000 mL | Freq: Once | INTRAVENOUS | Status: DC
Start: 2022-11-17 — End: 2022-11-17

## 2022-11-17 NOTE — Progress Notes (Signed)
Pt's states no medical or surgical changes since previsit or office visit. 

## 2022-11-17 NOTE — Op Note (Signed)
[facility] Patient Name: Jay Hall Procedure Date: 11/17/2022 12:56 PM MRN: [id number] Endoscopist: Doristine Locks , MD, [id number] Age: 47 Referring MD:  Date of Birth: 27-Nov-1975 Gender: Male Account #: [id number] Procedure:                Colonoscopy Indications:              Screening for colorectal malignant neoplasm, This                            is the patient's first colonoscopy Medicines:                Monitored Anesthesia Care Procedure:                Pre-Anesthesia Assessment:                           - Prior to the procedure, a History and Physical                            was performed, and patient medications and                            allergies were reviewed. The patient's tolerance of                            previous anesthesia was also reviewed. The risks                            and benefits of the procedure and the sedation                            options and risks were discussed with the patient.                            All questions were answered, and informed consent                            was obtained. Prior Anticoagulants: The patient has                            taken no anticoagulant or antiplatelet agents. ASA                            Grade Assessment: II - A patient with mild systemic                            disease. After reviewing the risks and benefits,                            the patient was deemed in satisfactory condition to                            undergo the procedure.  After obtaining informed consent, the colonoscope                            was passed under direct vision. Throughout the                            procedure, the patient's blood pressure, pulse, and                            oxygen saturations were monitored continuously. The                            Olympus SN [id number] was introduced through the anus                            and advanced to  the the cecum, identified by                            appendiceal orifice and ileocecal valve. The                            colonoscopy was performed without difficulty. The                            patient tolerated the procedure well. The quality                            of the bowel preparation was good. The ileocecal                            valve, appendiceal orifice, and rectum were                            photographed. Scope In: 1:32:32 PM Scope Out: 1:54:38 PM Scope Withdrawal Time: 0 hours 18 minutes 22 seconds  Total Procedure Duration: 0 hours 22 minutes 6 seconds  Findings:                 The perianal and digital rectal examinations were                            normal.                           A 3 mm polyp was found in the ascending colon. The                            polyp was sessile. The polyp was removed with a                            cold snare. Resection and retrieval were complete.                            Estimated blood loss was minimal.  A 5 mm polyp was found in the descending colon. The                            polyp was flat and mucous-capped. The polyp was                            removed with a cold snare. Resection and retrieval                            were complete. Estimated blood loss was minimal.                           Non-bleeding internal hemorrhoids were found during                            retroflexion. The hemorrhoids were small.                           Anal papilla(e) were hypertrophied. There was a                            single hypertrophied papilla with a long stalk and                            atypical appearing cap. Biopsies were taken with a                            cold forceps for histology. Estimated blood loss                            was minimal. Complications:            No immediate complications. Estimated Blood Loss:     Estimated blood loss was  minimal. Impression:               - One 3 mm polyp in the ascending colon, removed                            with a cold snare. Resected and retrieved.                           - One 5 mm polyp in the descending colon, removed                            with a cold snare. Resected and retrieved.                           - Non-bleeding internal hemorrhoids.                           - Anal papilla(e) were hypertrophied. Biopsied.                           - The GI Genius (intelligent endoscopy module),  computer-aided polyp detection system powered by AI                            was utilized to detect colorectal polyps through                            enhanced visualization during colonoscopy. Recommendation:           - Patient has a contact number available for                            emergencies. The signs and symptoms of potential                            delayed complications were discussed with the                            patient. Return to normal activities tomorrow.                            Written discharge instructions were provided to the                            patient.                           - Resume previous diet.                           - Continue present medications.                           - Await pathology results.                           - Repeat colonoscopy for surveillance based on                            pathology results.                           - Return to GI office PRN. Doristine Locks, MD 11/17/2022 2:00:35 PM

## 2022-11-17 NOTE — Progress Notes (Signed)
Uneventful anesthetic. Report to pacu rn. Vss. Care resumed by rn. 

## 2022-11-17 NOTE — Progress Notes (Signed)
Called to room to assist during endoscopic procedure.  Patient ID and intended procedure confirmed with present staff. Received instructions for my participation in the procedure from the performing physician.  

## 2022-11-17 NOTE — Progress Notes (Signed)
GASTROENTEROLOGY PROCEDURE H&P NOTE   Primary Care Physician: Tresa Garter, MD    Reason for Procedure:  Colon Cancer screening  Plan:    Colonoscopy  Patient is appropriate for endoscopic procedure(s) in the ambulatory ([facility]) setting.  The nature of the procedure, as well as the risks, benefits, and alternatives were carefully and thoroughly reviewed with the patient. Ample time for discussion and questions allowed. The patient understood, was satisfied, and agreed to proceed.     HPI: Jay Hall is a 47 y.o. male who presents for colonoscopy for routine Colon Cancer screening.  No active GI symptoms.  No known family history of colon cancer or related malignancy.  Patient is otherwise without complaints or active issues today.  Past Medical History:  Diagnosis Date   Epilepsy [facility]) 1998   Dr. Blane Ohara    Past Surgical History:  Procedure Laterality Date   NO PAST SURGERIES      Prior to Admission medications   Medication Sig Start Date End Date Taking? Authorizing Provider  ciclopirox (PENLAC) 8 % solution Apply topically daily. 08/03/22  Yes Plotnikov, Georgina Quint, MD  DILANTIN 100 MG ER capsule TAKE 3 CAPSULES DAILY 06/15/22  Yes Millikan, Megan, NP  DILANTIN INFATABS 50 MG tablet CHEW 1 TABLET DAILY 09/28/22  Yes Camara, Amadou, MD  doxycycline (VIBRA-TABS) 100 MG tablet Take 1 tablet (100 mg total) by mouth 2 (two) times daily. 08/03/22  Yes Plotnikov, Georgina Quint, MD  CALCIUM PO Take by mouth daily.    [provider]  cholecalciferol 25 MCG (1000 UT) tablet Take 1,000 Units by mouth daily. 06/05/16   [provider]  Midazolam (NAYZILAM) 5 MG/0.1ML SOLN Place 5 mg into the nose as needed (For seizure lasting more than 2 minutes). 09/27/22   Windell Norfolk, MD  zolpidem (AMBIEN) 10 MG tablet Take 1 tablet (10 mg total) by mouth at bedtime as needed for sleep. 08/03/22   Plotnikov, Georgina Quint, MD    Current Outpatient Medications  Medication  Sig Dispense Refill   ciclopirox (PENLAC) 8 % solution Apply topically daily. 6.6 mL 1   DILANTIN 100 MG ER capsule TAKE 3 CAPSULES DAILY 270 capsule 3   DILANTIN INFATABS 50 MG tablet CHEW 1 TABLET DAILY 90 tablet 0   doxycycline (VIBRA-TABS) 100 MG tablet Take 1 tablet (100 mg total) by mouth 2 (two) times daily. 60 tablet 3   CALCIUM PO Take by mouth daily.     cholecalciferol 25 MCG (1000 UT) tablet Take 1,000 Units by mouth daily.     Midazolam (NAYZILAM) 5 MG/0.1ML SOLN Place 5 mg into the nose as needed (For seizure lasting more than 2 minutes). 2 each 0   zolpidem (AMBIEN) 10 MG tablet Take 1 tablet (10 mg total) by mouth at bedtime as needed for sleep. 30 tablet 1   Current Facility-Administered Medications  Medication Dose Route Frequency Provider Last Rate Last Admin   0.9 %  sodium chloride infusion  500 mL Intravenous Once Rosary Filosa V, DO        Allergies as of 11/17/2022   (No Known Allergies)    Family History  Problem Relation Age of Onset   Hypertension Mother    Cancer Father        Prostate   Hypertension Father    Seizures Neg Hx    Colon cancer Neg Hx    Colon polyps Neg Hx    Esophageal cancer Neg Hx    Rectal cancer  Neg Hx    Stomach cancer Neg Hx     Social History   Socioeconomic History   Marital status: Married    Spouse name: Jay Hall   Number of children: 2   Years of education: BA   Highest education level: Not on file  Occupational History   Occupation: Nurse, adult support    Employer: [employer]  Tobacco Use   Smoking status: Never   Smokeless tobacco: Never  Vaping Use   Vaping Use: Never used  Substance and Sexual Activity   Alcohol use: Yes    Alcohol/week: 2.0 standard drinks of alcohol    Types: 2 Cans of beer per week   Drug use: No   Sexual activity: Yes    Birth control/protection: Condom  Other Topics Concern   Not on file  Social History Narrative   Regular exercise-no   Patient lives at home  with with [place].    Patient has 2 children.    Patient has a BA degree.    Social Determinants of Health   Financial Resource Strain: Not on file  Food Insecurity: Not on file  Transportation Needs: Not on file  Physical Activity: Not on file  Stress: Not on file  Social Connections: Not on file  Intimate Partner Violence: Not on file    Physical Exam: Vital signs in last 24 hours: @BP  (!) 125/90   Pulse 88   Temp 97.8 F (36.6 C)   Ht 6' (1.829 m)   Wt 183 lb 3.2 oz (83.1 kg)   SpO2 100%   BMI 24.85 kg/m  GEN: NAD EYE: Sclerae anicteric ENT: MMM CV: Non-tachycardic Pulm: CTA b/l GI: Soft, NT/ND NEURO:  Alert & Oriented x 3   Doristine Locks, DO  Gastroenterology   11/17/2022 1:12 PM

## 2022-11-17 NOTE — Patient Instructions (Signed)
Handouts Provided:  Polyps  YOU HAD AN ENDOSCOPIC PROCEDURE TODAY AT THE [facility]:   Refer to the procedure report that was given to you for any specific questions about what was found during the examination.  If the procedure report does not answer your questions, please call your gastroenterologist to clarify.  If you requested that your care partner not be given the details of your procedure findings, then the procedure report has been included in a sealed envelope for you to review at your convenience later.  YOU SHOULD EXPECT: Some feelings of bloating in the abdomen. Passage of more gas than usual.  Walking can help get rid of the air that was put into your GI tract during the procedure and reduce the bloating. If you had a lower endoscopy (such as a colonoscopy or flexible sigmoidoscopy) you may notice spotting of blood in your stool or on the toilet paper. If you underwent a bowel prep for your procedure, you may not have a normal bowel movement for a few days.  Please Note:  You might notice some irritation and congestion in your nose or some drainage.  This is from the oxygen used during your procedure.  There is no need for concern and it should clear up in a day or so.  SYMPTOMS TO REPORT IMMEDIATELY:  Following lower endoscopy (colonoscopy or flexible sigmoidoscopy):  Excessive amounts of blood in the stool  Significant tenderness or worsening of abdominal pains  Swelling of the abdomen that is new, acute  Fever of 100F or higher  For urgent or emergent issues, a gastroenterologist can be reached at any hour by calling [phone]. Do not use MyChart messaging for urgent concerns.    DIET:  We do recommend a small meal at first, but then you may proceed to your regular diet.  Drink plenty of fluids but you should avoid alcoholic beverages for 24 hours.  ACTIVITY:  You should plan to take it easy for the rest of today and you should NOT DRIVE or use heavy  machinery until tomorrow (because of the sedation medicines used during the test).    FOLLOW UP: Our staff will call the number listed on your records the next business day following your procedure.  We will call around 7:15- 8:00 am to check on you and address any questions or concerns that you may have regarding the information given to you following your procedure. If we do not reach you, we will leave a message.     If any biopsies were taken you will be contacted by phone or by letter within the next 1-3 weeks.  Please call us at [phone] if you have not heard about the biopsies in 3 weeks.    SIGNATURES/CONFIDENTIALITY: You and/or your care partner have signed paperwork which will be entered into your electronic medical record.  These signatures attest to the fact that that the information above on your After Visit Summary has been reviewed and is understood.  Full responsibility of the confidentiality of this discharge information lies with you and/or your care-partner.  

## 2022-11-21 ENCOUNTER — Telehealth: Payer: Self-pay | Admitting: *Deleted

## 2022-11-21 NOTE — Telephone Encounter (Signed)
  Follow up Call-     11/17/2022   12:45 PM  Call back number  Post procedure Call Back phone  # [phone]  Permission to leave phone message Yes     Patient questions:  Do you have a fever, pain , or abdominal swelling? No. Pain Score  0 *  Have you tolerated food without any problems? Yes.    Have you been able to return to your normal activities? Yes.    Do you have any questions about your discharge instructions: Diet   No. Medications  No. Follow up visit  No.  Do you have questions or concerns about your Care? No.  Actions: * If pain score is 4 or above: No action needed, pain <4.

## 2022-11-24 DIAGNOSIS — M9901 Segmental and somatic dysfunction of cervical region: Secondary | ICD-10-CM | POA: Diagnosis not present

## 2022-11-24 DIAGNOSIS — M9903 Segmental and somatic dysfunction of lumbar region: Secondary | ICD-10-CM | POA: Diagnosis not present

## 2022-11-24 DIAGNOSIS — M9902 Segmental and somatic dysfunction of thoracic region: Secondary | ICD-10-CM | POA: Diagnosis not present

## 2022-11-24 DIAGNOSIS — M531 Cervicobrachial syndrome: Secondary | ICD-10-CM | POA: Diagnosis not present

## 2022-12-12 ENCOUNTER — Other Ambulatory Visit: Payer: Self-pay | Admitting: Neurology

## 2023-01-01 DIAGNOSIS — L718 Other rosacea: Secondary | ICD-10-CM | POA: Diagnosis not present

## 2023-01-04 DIAGNOSIS — M531 Cervicobrachial syndrome: Secondary | ICD-10-CM | POA: Diagnosis not present

## 2023-01-04 DIAGNOSIS — M9903 Segmental and somatic dysfunction of lumbar region: Secondary | ICD-10-CM | POA: Diagnosis not present

## 2023-01-04 DIAGNOSIS — M9901 Segmental and somatic dysfunction of cervical region: Secondary | ICD-10-CM | POA: Diagnosis not present

## 2023-01-04 DIAGNOSIS — M9902 Segmental and somatic dysfunction of thoracic region: Secondary | ICD-10-CM | POA: Diagnosis not present

## 2023-01-09 ENCOUNTER — Other Ambulatory Visit: Payer: Self-pay | Admitting: Neurology

## 2023-01-09 MED ORDER — NAYZILAM 5 MG/0.1ML NA SOLN: 5.0000 mg | NASAL | 0 refills | Status: AC | PRN

## 2023-02-28 ENCOUNTER — Other Ambulatory Visit: Payer: Self-pay | Admitting: Internal Medicine

## 2023-03-16 DIAGNOSIS — M9902 Segmental and somatic dysfunction of thoracic region: Secondary | ICD-10-CM | POA: Diagnosis not present

## 2023-03-16 DIAGNOSIS — M9901 Segmental and somatic dysfunction of cervical region: Secondary | ICD-10-CM | POA: Diagnosis not present

## 2023-03-16 DIAGNOSIS — M531 Cervicobrachial syndrome: Secondary | ICD-10-CM | POA: Diagnosis not present

## 2023-03-16 DIAGNOSIS — M9903 Segmental and somatic dysfunction of lumbar region: Secondary | ICD-10-CM | POA: Diagnosis not present

## 2023-07-27 DIAGNOSIS — M9903 Segmental and somatic dysfunction of lumbar region: Secondary | ICD-10-CM | POA: Diagnosis not present

## 2023-07-27 DIAGNOSIS — M9901 Segmental and somatic dysfunction of cervical region: Secondary | ICD-10-CM | POA: Diagnosis not present

## 2023-07-27 DIAGNOSIS — M531 Cervicobrachial syndrome: Secondary | ICD-10-CM | POA: Diagnosis not present

## 2023-07-27 DIAGNOSIS — M9904 Segmental and somatic dysfunction of sacral region: Secondary | ICD-10-CM | POA: Diagnosis not present

## 2023-07-27 DIAGNOSIS — M9902 Segmental and somatic dysfunction of thoracic region: Secondary | ICD-10-CM | POA: Diagnosis not present

## 2023-09-07 DIAGNOSIS — M531 Cervicobrachial syndrome: Secondary | ICD-10-CM | POA: Diagnosis not present

## 2023-09-07 DIAGNOSIS — M9901 Segmental and somatic dysfunction of cervical region: Secondary | ICD-10-CM | POA: Diagnosis not present

## 2023-09-07 DIAGNOSIS — M9902 Segmental and somatic dysfunction of thoracic region: Secondary | ICD-10-CM | POA: Diagnosis not present

## 2023-09-07 DIAGNOSIS — M9903 Segmental and somatic dysfunction of lumbar region: Secondary | ICD-10-CM | POA: Diagnosis not present

## 2023-09-11 DIAGNOSIS — L718 Other rosacea: Secondary | ICD-10-CM | POA: Diagnosis not present

## 2023-09-11 DIAGNOSIS — B351 Tinea unguium: Secondary | ICD-10-CM | POA: Diagnosis not present

## 2023-10-19 DIAGNOSIS — M9901 Segmental and somatic dysfunction of cervical region: Secondary | ICD-10-CM | POA: Diagnosis not present

## 2023-10-19 DIAGNOSIS — M9902 Segmental and somatic dysfunction of thoracic region: Secondary | ICD-10-CM | POA: Diagnosis not present

## 2023-10-19 DIAGNOSIS — M9903 Segmental and somatic dysfunction of lumbar region: Secondary | ICD-10-CM | POA: Diagnosis not present

## 2023-10-19 DIAGNOSIS — M531 Cervicobrachial syndrome: Secondary | ICD-10-CM | POA: Diagnosis not present

## 2023-11-16 ENCOUNTER — Other Ambulatory Visit: Payer: Self-pay | Admitting: Internal Medicine

## 2023-11-22 ENCOUNTER — Ambulatory Visit: Payer: BC Managed Care – PPO | Admitting: Neurology

## 2023-11-22 ENCOUNTER — Encounter: Payer: Self-pay | Admitting: Neurology

## 2023-11-22 VITALS — BP 119/81 | HR 87 | Ht 72.0 in | Wt 189.5 lb

## 2023-11-22 DIAGNOSIS — R569 Unspecified convulsions: Secondary | ICD-10-CM

## 2023-11-22 DIAGNOSIS — E559 Vitamin D deficiency, unspecified: Secondary | ICD-10-CM | POA: Diagnosis not present

## 2023-11-22 NOTE — Patient Instructions (Signed)
 Doing well off antiseizure medication  Will check a Vitamin D  level today  He still has the seizure rescue medication Continue to follow up with PCP Return as needed

## 2023-11-22 NOTE — Progress Notes (Signed)
 PATIENT: Jay Hall DOB: 1976/01/07  REASON FOR VISIT: follow up HISTORY FROM: patient  HISTORY OF PRESENT ILLNESS: Today 11/22/23: Patient presents today for follow-up, he is alone.  Last visit was in May 2024.  At that time we discussed weaning and stopping the medication.  He started the weaning process in July and by September he was off medication.  He has been off the Dilantin  since September 2024, has been doing well since, no seizure or seizure like activity.  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  His last seizure was more than 25 years ago.  INTERVAL HISTORY 11/16/2022: Patient presented for follow-up, last visit was in March since then he has been doing well no seizure or seizure-like activity.  He would like to discuss coming off medication.  He is on Dilantin  350 nightly and his last seizure was more than 25 years ago.  He is agreeable to come off medication. His routine EEG, last month was normal.    INTERVAL HISTORY 09/11/2022 Patient presents today, last visit was in January 2023.  At that time he reported not having any seizures.  He still on phenytoin  350 mg nightly.  He reported his last seizure was in 1998.  He was still in school.  Prior to his seizure he will have an aura of hearing a lot of chatter then will have a convulsion.  Again his last seizure was more than 25 years ago.  He is compliant with his Dilantin .  He would like to come off medication  Interval History 07/26/2021: MM Mr. Shells is a 48 year old male with a history of seizures.  He returns today for follow-up.  He continues on Dilantin  3 and a 50 mg daily.  His last DEXA scan was in 2018.  He continues to take vitamin C and D supplements.  He denies any seizure events.  He is asking today about switching to another seizure medication.  He is concerned about potential long-term effects of Dilantin .  He is currently operating a motor vehicle.   07/26/20: Mr. Birnie is a 48 year old male  with a history of seizures.  He returns today for follow-up.  Reports overall he continues to do very well.  He denies any seizure events.  Continues on Dilantin  350 mg daily.  Denies any changes with his gait or balance.  Continues to complete all ADLs independently.  Operates a Librarian, academic.  07/24/19:  Mr. Persley is a 48 year old male with a history of seizures.  He returns today for follow-up.  He continues on Dilantin  350 mg daily.  He denies any seizure events.  Reports that he tolerates the medication well although it is expensive.  He operates a Librarian, academic without difficulty.  Denies any changes with his gait or balance.  He returns today for an evaluation.  04/29/18:   Mr. Hakim is a 48 year old male with a history of seizures.  He returns today for follow-up.  He remains on Dilantin  350 mg daily.  He denies any seizure events.  Continue to operate a motor vehicle.  He is able to complete all ADLs independently.  His last bone density scan was in 2018 which was unremarkable.  Reports that he takes vitamin D  off and on.  He returns today for evaluation.   REVIEW OF SYSTEMS: Out of a complete 14 system review of symptoms, the patient complains only of the following symptoms, and all other reviewed systems are negative.  See HPI  ALLERGIES: No  Known Allergies  HOME MEDICATIONS: Outpatient Medications Prior to Visit  Medication Sig Dispense Refill   cholecalciferol 25 MCG (1000 UT) tablet Take 1,000 Units by mouth daily.     ciclopirox  (PENLAC ) 8 % solution Apply topically daily. 6.6 mL 1   doxycycline  (VIBRA -TABS) 100 MG tablet TAKE 1 TABLET BY MOUTH TWICE A DAY 60 tablet 3   Midazolam  (NAYZILAM ) 5 MG/0.1ML SOLN Place 5 mg into the nose as needed (For seizure lasting more than 2 minutes). 2 each 0   zolpidem  (AMBIEN ) 10 MG tablet Take 1 tablet (10 mg total) by mouth at bedtime as needed for sleep. 30 tablet 1   CALCIUM PO Take by mouth daily.     DILANTIN  100 MG ER capsule  TAKE 3 CAPSULES DAILY 270 capsule 3   DILANTIN  INFATABS 50 MG tablet CHEW 1 TABLET DAILY 90 tablet 3   No facility-administered medications prior to visit.    PAST MEDICAL HISTORY: Past Medical History:  Diagnosis Date   Epilepsy (HCC) 1998   Dr. Nila Barth    PAST SURGICAL HISTORY: Past Surgical History:  Procedure Laterality Date   NO PAST SURGERIES      FAMILY HISTORY: Family History  Problem Relation Age of Onset   Hypertension Mother    Cancer Father        Prostate   Hypertension Father    Seizures Neg Hx    Colon cancer Neg Hx    Colon polyps Neg Hx    Esophageal cancer Neg Hx    Rectal cancer Neg Hx    Stomach cancer Neg Hx     SOCIAL HISTORY: Social History   Socioeconomic History   Marital status: Married    Spouse name: Peterson Brandt   Number of children: 2   Years of education: BA   Highest education level: Not on file  Occupational History   Occupation: Nurse, adult support    Employer: [employer]  Tobacco Use   Smoking status: Never   Smokeless tobacco: Never  Vaping Use   Vaping status: Never Used  Substance and Sexual Activity   Alcohol use: Yes    Alcohol/week: 2.0 standard drinks of alcohol    Types: 2 Cans of beer per week   Drug use: No   Sexual activity: Yes    Birth control/protection: Condom  Other Topics Concern   Not on file  Social History Narrative   Regular exercise-no   Patient lives at home with with [place].    Patient has 2 children.    Patient has a BA degree.    Social Drivers of Corporate investment banker Strain: Not on file  Food Insecurity: Not on file  Transportation Needs: Not on file  Physical Activity: Not on file  Stress: Not on file  Social Connections: Not on file  Intimate Partner Violence: Not on file      PHYSICAL EXAM  Vitals:   11/22/23 0914  BP: 119/81  Pulse: 87  Weight: 189 lb 8 oz (86 kg)  Height: 6' (1.829 m)     Body mass index is 25.7 kg/m.  Generalized:  Well developed, in no acute distress   Neurological examination  Mentation: Alert oriented to time, place, history taking. Follows all commands speech and language fluent Cranial nerve II-XII: Pupils were equal round reactive to light. Extraocular movements were full, visual field were full on confrontational test.  Head turning and shoulder shrug  were normal and symmetric. Motor: The motor  testing reveals 5 over 5 strength of all 4 extremities. Good symmetric motor tone is noted throughout.  Sensory: Sensory testing is intact to soft touch on all 4 extremities. No evidence of extinction is noted.  Coordination: Cerebellar testing reveals good finger-nose-finger and heel-to-shin bilaterally.  Gait and station: Gait is normal. Tandem gait is normal. Romberg is negative. No drift is seen.  Reflexes: Deep tendon reflexes are symmetric and normal bilaterally.   DIAGNOSTIC DATA (LABS, IMAGING, TESTING) - I reviewed patient records, labs, notes, testing and imaging myself where available.  Lab Results  Component Value Date   WBC 3.5 (L) 08/03/2022   HGB 15.4 08/03/2022   HCT 45.5 08/03/2022   MCV 95.1 08/03/2022   PLT 212.0 08/03/2022      Component Value Date/Time   NA 140 08/03/2022 0912   NA 141 07/26/2021 0825   K 4.9 08/03/2022 0912   CL 103 08/03/2022 0912   CO2 29 08/03/2022 0912   GLUCOSE 92 08/03/2022 0912   BUN 17 08/03/2022 0912   BUN 16 07/26/2021 0825   CREATININE 1.05 08/03/2022 0912   CALCIUM 9.5 08/03/2022 0912   PROT 6.8 08/03/2022 0912   PROT 6.8 07/26/2021 0825   ALBUMIN 4.5 08/03/2022 0912   ALBUMIN 4.8 07/26/2021 0825   AST 17 08/03/2022 0912   ALT 18 08/03/2022 0912   ALKPHOS 55 08/03/2022 0912   BILITOT 0.4 08/03/2022 0912   BILITOT 0.3 07/26/2021 0825   GFRNONAA 99 07/26/2020 0858   GFRAA 115 07/26/2020 0858   Lab Results  Component Value Date   CHOL 164 08/03/2022   HDL 52.10 08/03/2022   LDLCALC 95 08/03/2022   TRIG 82.0 08/03/2022   CHOLHDL 3  08/03/2022   Lab Results  Component Value Date   HGBA1C 5.4 08/24/2020   Lab Results  Component Value Date   VITAMINB12 241 02/15/2010   Lab Results  Component Value Date   TSH 1.14 08/03/2022   Routine EEG 09/27/22: Normal    ASSESSMENT AND PLAN 48 y.o. year old male  has a past medical history of Epilepsy (HCC) (1998). here with :  1.  Seizures  -He is off Dilantin  since September 2024, doing well, no seizure or seizure like activity -Advised if he has any seizure events he should let us  know. -Nayzilam  as rescue medication -Will check Vitamin D  today  -Continue to follow up with PCP  -Return as needed. 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    I have spent a total of 30 minutes dedicated to this patient today, preparing to see patient, performing a medically appropriate examination and evaluation, ordering tests and/or medications and procedures, and counseling and educating the patient/family/caregiver; independently interpreting result and communicating results to the family/patient/caregiver; and documenting clinical information in the electronic medical record.   Cassandra Cleveland, MD 11/22/2023, 9:19 AM [facility] [address], [address] [place], [place] [address] [phone]

## 2023-11-23 ENCOUNTER — Ambulatory Visit: Payer: Self-pay | Admitting: Neurology

## 2023-11-23 LAB — VITAMIN D 25 HYDROXY (VIT D DEFICIENCY, FRACTURES): Vit D, 25-Hydroxy: 35.7 ng/mL (ref 30.0–100.0)

## 2023-11-28 DIAGNOSIS — B353 Tinea pedis: Secondary | ICD-10-CM | POA: Diagnosis not present

## 2023-11-30 DIAGNOSIS — M531 Cervicobrachial syndrome: Secondary | ICD-10-CM | POA: Diagnosis not present

## 2023-11-30 DIAGNOSIS — M9902 Segmental and somatic dysfunction of thoracic region: Secondary | ICD-10-CM | POA: Diagnosis not present

## 2023-11-30 DIAGNOSIS — M9901 Segmental and somatic dysfunction of cervical region: Secondary | ICD-10-CM | POA: Diagnosis not present

## 2023-11-30 DIAGNOSIS — M9903 Segmental and somatic dysfunction of lumbar region: Secondary | ICD-10-CM | POA: Diagnosis not present

## 2024-04-29 DIAGNOSIS — L821 Other seborrheic keratosis: Secondary | ICD-10-CM | POA: Diagnosis not present

## 2024-04-29 DIAGNOSIS — D224 Melanocytic nevi of scalp and neck: Secondary | ICD-10-CM | POA: Diagnosis not present

## 2024-04-29 DIAGNOSIS — D2261 Melanocytic nevi of right upper limb, including shoulder: Secondary | ICD-10-CM | POA: Diagnosis not present
